# Patient Record
Sex: Female | Born: 1995 | Race: White | Hispanic: No | Marital: Single | Smoking: Never smoker
Health system: Southern US, Community
[De-identification: ages and names within clinical notes are randomized; demographics above are authoritative.]

## PROBLEM LIST (undated history)

## (undated) DIAGNOSIS — F419 Anxiety disorder, unspecified: Secondary | ICD-10-CM

---
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
## 2016-03-25 DIAGNOSIS — F900 Attention-deficit hyperactivity disorder, predominantly inattentive type: Secondary | ICD-10-CM | POA: Diagnosis not present

## 2016-11-06 DIAGNOSIS — F900 Attention-deficit hyperactivity disorder, predominantly inattentive type: Secondary | ICD-10-CM | POA: Diagnosis not present

## 2017-03-24 DIAGNOSIS — F4323 Adjustment disorder with mixed anxiety and depressed mood: Secondary | ICD-10-CM | POA: Diagnosis not present

## 2017-05-09 ENCOUNTER — Inpatient Hospital Stay (HOSPITAL_COMMUNITY)
Admission: AD | Admit: 2017-05-09 | Discharge: 2017-05-12 | DRG: 885 | Disposition: A | Discharge status: Home / Self Care | Payer: BLUE CROSS/BLUE SHIELD | Source: Intra-hospital | Attending: Psychiatry | Admitting: Psychiatry

## 2017-05-09 ENCOUNTER — Emergency Department (HOSPITAL_COMMUNITY)
Admission: EM | Admit: 2017-05-09 | Discharge: 2017-05-09 | Disposition: A | Discharge status: Other Acute Inpatient Hospital | Payer: BLUE CROSS/BLUE SHIELD | Attending: Emergency Medicine | Admitting: Emergency Medicine

## 2017-05-09 ENCOUNTER — Encounter (HOSPITAL_COMMUNITY): Payer: Self-pay | Admitting: Nurse Practitioner

## 2017-05-09 ENCOUNTER — Other Ambulatory Visit: Payer: Self-pay

## 2017-05-09 DIAGNOSIS — Z91040 Latex allergy status: Secondary | ICD-10-CM | POA: Diagnosis not present

## 2017-05-09 DIAGNOSIS — R55 Syncope and collapse: Secondary | ICD-10-CM

## 2017-05-09 DIAGNOSIS — F332 Major depressive disorder, recurrent severe without psychotic features: Secondary | ICD-10-CM | POA: Insufficient documentation

## 2017-05-09 DIAGNOSIS — F418 Other specified anxiety disorders: Secondary | ICD-10-CM | POA: Diagnosis not present

## 2017-05-09 DIAGNOSIS — Z79899 Other long term (current) drug therapy: Secondary | ICD-10-CM | POA: Insufficient documentation

## 2017-05-09 DIAGNOSIS — Z62811 Personal history of psychological abuse in childhood: Secondary | ICD-10-CM | POA: Diagnosis not present

## 2017-05-09 DIAGNOSIS — F410 Panic disorder [episodic paroxysmal anxiety] without agoraphobia: Secondary | ICD-10-CM | POA: Diagnosis present

## 2017-05-09 DIAGNOSIS — F909 Attention-deficit hyperactivity disorder, unspecified type: Secondary | ICD-10-CM | POA: Diagnosis not present

## 2017-05-09 DIAGNOSIS — E860 Dehydration: Secondary | ICD-10-CM

## 2017-05-09 DIAGNOSIS — Z630 Problems in relationship with spouse or partner: Secondary | ICD-10-CM | POA: Diagnosis not present

## 2017-05-09 DIAGNOSIS — F4323 Adjustment disorder with mixed anxiety and depressed mood: Secondary | ICD-10-CM

## 2017-05-09 DIAGNOSIS — R110 Nausea: Secondary | ICD-10-CM | POA: Diagnosis not present

## 2017-05-09 DIAGNOSIS — F419 Anxiety disorder, unspecified: Secondary | ICD-10-CM | POA: Diagnosis not present

## 2017-05-09 DIAGNOSIS — Z560 Unemployment, unspecified: Secondary | ICD-10-CM | POA: Diagnosis not present

## 2017-05-09 DIAGNOSIS — F1021 Alcohol dependence, in remission: Secondary | ICD-10-CM | POA: Diagnosis not present

## 2017-05-09 DIAGNOSIS — F3289 Other specified depressive episodes: Secondary | ICD-10-CM

## 2017-05-09 DIAGNOSIS — F1994 Other psychoactive substance use, unspecified with psychoactive substance-induced mood disorder: Secondary | ICD-10-CM | POA: Diagnosis not present

## 2017-05-09 DIAGNOSIS — R109 Unspecified abdominal pain: Secondary | ICD-10-CM | POA: Diagnosis not present

## 2017-05-09 DIAGNOSIS — R45851 Suicidal ideations: Secondary | ICD-10-CM | POA: Diagnosis not present

## 2017-05-09 DIAGNOSIS — R450 Nervousness: Secondary | ICD-10-CM | POA: Diagnosis not present

## 2017-05-09 HISTORY — DX: Anxiety disorder, unspecified: F41.9

## 2017-05-09 LAB — RAPID URINE DRUG SCREEN, HOSP PERFORMED
Amphetamines: POSITIVE — AB
BARBITURATES: NOT DETECTED
Benzodiazepines: NOT DETECTED
COCAINE: NOT DETECTED
Opiates: NOT DETECTED
TETRAHYDROCANNABINOL: NOT DETECTED

## 2017-05-09 LAB — COMPREHENSIVE METABOLIC PANEL
ALT: 13 U/L — AB (ref 14–54)
AST: 18 U/L (ref 15–41)
Albumin: 4.6 g/dL (ref 3.5–5.0)
Alkaline Phosphatase: 41 U/L (ref 38–126)
Anion gap: 9 (ref 5–15)
BUN: 8 mg/dL (ref 6–20)
CHLORIDE: 107 mmol/L (ref 101–111)
CO2: 25 mmol/L (ref 22–32)
Calcium: 8.8 mg/dL — ABNORMAL LOW (ref 8.9–10.3)
Creatinine, Ser: 0.68 mg/dL (ref 0.44–1.00)
GFR calc Af Amer: >60 mL/min (ref >60)
GFR calc non Af Amer: >60 mL/min (ref >60)
Glucose, Bld: 92 mg/dL (ref 65–99)
POTASSIUM: 3.8 mmol/L (ref 3.5–5.1)
SODIUM: 141 mmol/L (ref 135–145)
Total Bilirubin: 0.8 mg/dL (ref 0.3–1.2)
Total Protein: 7.3 g/dL (ref 6.5–8.1)

## 2017-05-09 LAB — CBC WITH DIFFERENTIAL/PLATELET
BASOS ABS: 0 10*3/uL (ref 0.0–0.1)
Basophils Relative: 0 %
EOS ABS: 0.1 10*3/uL (ref 0.0–0.7)
EOS PCT: 1 %
HCT: 38.7 % (ref 36.0–46.0)
Hemoglobin: 13.3 g/dL (ref 12.0–15.0)
LYMPHS ABS: 2 10*3/uL (ref 0.7–4.0)
LYMPHS PCT: 42 %
MCH: 29.8 pg (ref 26.0–34.0)
MCHC: 34.4 g/dL (ref 30.0–36.0)
MCV: 86.6 fL (ref 78.0–100.0)
MONO ABS: 0.4 10*3/uL (ref 0.1–1.0)
Monocytes Relative: 9 %
Neutro Abs: 2.3 10*3/uL (ref 1.7–7.7)
Neutrophils Relative %: 48 %
PLATELETS: 212 10*3/uL (ref 150–400)
RBC: 4.47 MIL/uL (ref 3.87–5.11)
RDW: 12.5 % (ref 11.5–15.5)
WBC: 4.7 10*3/uL (ref 4.0–10.5)

## 2017-05-09 LAB — URINALYSIS, ROUTINE W REFLEX MICROSCOPIC
BILIRUBIN URINE: NEGATIVE
GLUCOSE, UA: NEGATIVE mg/dL
HGB URINE DIPSTICK: NEGATIVE
KETONES UR: NEGATIVE mg/dL
NITRITE: NEGATIVE
PH: 5 (ref 5.0–8.0)
Protein, ur: NEGATIVE mg/dL
Specific Gravity, Urine: 1.018 (ref 1.005–1.030)

## 2017-05-09 LAB — ACETAMINOPHEN LEVEL

## 2017-05-09 LAB — I-STAT BETA HCG BLOOD, ED (MC, WL, AP ONLY)

## 2017-05-09 LAB — ETHANOL: ALCOHOL ETHYL (B): 80 mg/dL — AB (ref <10)

## 2017-05-09 LAB — SALICYLATE LEVEL

## 2017-05-09 MED ORDER — ACETAMINOPHEN 500 MG PO TABS: 500.0000 mg | ORAL_TABLET | Freq: Four times a day (QID) | ORAL | Status: DC | PRN

## 2017-05-09 MED ORDER — CITALOPRAM HYDROBROMIDE 10 MG PO TABS
10.0000 mg | ORAL_TABLET | Freq: Every day | ORAL | Status: DC
  Administered 2017-05-09: 10 mg via ORAL
  Filled 2017-05-09: qty 1

## 2017-05-09 MED ORDER — HYDROXYZINE HCL 25 MG PO TABS: 25.0000 mg | ORAL_TABLET | Freq: Four times a day (QID) | ORAL | Status: DC | PRN

## 2017-05-09 MED ORDER — LORAZEPAM 1 MG PO TABS: 1.0000 mg | ORAL_TABLET | Freq: Once | ORAL | Status: DC

## 2017-05-09 MED ORDER — SODIUM CHLORIDE 0.9 % IV BOLUS (SEPSIS)
1000.0000 mL | Freq: Once | INTRAVENOUS | Status: AC
  Administered 2017-05-09: 1000 mL via INTRAVENOUS

## 2017-05-09 MED ORDER — HYDROXYZINE HCL 25 MG PO TABS
25.0000 mg | ORAL_TABLET | Freq: Once | ORAL | Status: AC
  Administered 2017-05-09: 25 mg via ORAL
  Filled 2017-05-09: qty 1

## 2017-05-09 NOTE — ED Notes (Signed)
TTS at bedside. 

## 2017-05-09 NOTE — ED Provider Notes (Signed)
[facility]-EMERGENCY DEPT Provider Note   CSN: [id number] Arrival date & time: 05/09/17  [id number]     History   Chief Complaint Chief Complaint  Patient presents with  . Anxiety  . Suicidal    HPI Penny Mendoza is a 21 y.o. female history of ADHD presenting with anxiety.  Patient states that her boyfriend broke up with her about a year ago and they have been trying to work out the relationship since then.  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  She also works a job and just was told this morning that she was fired from the job.  She subsequently became very upset over the situation and became tearful.  She has been depressed over the last year or so since her breakup with her boyfriend.  Her father died when she was teenage years and she has occasional bouts of depression since then but is not currently on medicines for depression.  She has no particular plans to kill herself but just feels very depressed and anxious.  She is not currently on any medicines for anxiety and takes Adderall as needed for ADHD.   The history is provided by the patient.    Past Medical History:  Diagnosis Date  . Anxiety     There are no active problems to display for this patient.   History reviewed. No pertinent surgical history.  OB History    No data available       Home Medications    Prior to Admission medications   Medication Sig Start Date End Date Taking? Authorizing Provider  amphetamine-dextroamphetamine (ADDERALL XR) 30 MG 24 hr capsule Take 30 mg by mouth daily.   Yes [provider]  BIOTIN PO Take 1 tablet by mouth daily.   Yes [provider]  Misc Natural Products (PERFORMANCE FORMULA PO) Take 1 Scoop by mouth daily as needed (as needed for when she goes to the gym).   Yes [provider]    Family History History reviewed. No pertinent family history.  Social History Social History   Tobacco Use  .  Smoking status: Never Smoker  . Smokeless tobacco: Never Used  Substance Use Topics  . Alcohol use: Yes    Comment: occassionaly   . Drug use: No     Allergies   Latex   Review of Systems Review of Systems  Psychiatric/Behavioral: Positive for dysphoric mood. The patient is nervous/anxious.   All other systems reviewed and are negative.    Physical Exam Updated Vital Signs BP 114/68 (BP Location: Right Arm)   Pulse 94   Temp 98.6 F (37 C) (Oral)   Resp 20   Ht 5\' 5"  (1.651 m)   Wt 61.2 kg (135 lb)   LMP 04/14/2017 (Approximate)   SpO2 100%   BMI 22.47 kg/m   Physical Exam  Constitutional: She is oriented to person, place, and time.  Anxious, tearful   HENT:  Head: Normocephalic.  Mouth/Throat: Oropharynx is clear and moist.  Eyes: Conjunctivae and EOM are normal. Pupils are equal, round, and reactive to light.  Neck: Normal range of motion. Neck supple.  Cardiovascular: Normal rate, regular rhythm and normal heart sounds.  Pulmonary/Chest: Effort normal and breath sounds normal. No stridor. No respiratory distress. She has no wheezes.  Abdominal: Soft. Bowel sounds are normal. She exhibits no distension. There is no tenderness. There is no guarding.  Musculoskeletal: Normal range of motion.  Neurological: She  is alert and oriented to person, place, and time. No cranial nerve deficit. Coordination normal.  Skin: Skin is warm.  Psychiatric:  Anxious, tearful   Nursing note and vitals reviewed.    ED Treatments / Results  Labs (all labs ordered are listed, but only abnormal results are displayed) Labs Reviewed  COMPREHENSIVE METABOLIC PANEL - Abnormal; Notable for the following components:      Result Value   Calcium 8.8 (*)    ALT 13 (*)    All other components within normal limits  URINALYSIS, ROUTINE W REFLEX MICROSCOPIC - Abnormal; Notable for the following components:   APPearance HAZY (*)    Leukocytes, UA SMALL (*)    Bacteria, UA RARE (*)     Squamous Epithelial / LPF 6-30 (*)    All other components within normal limits  RAPID URINE DRUG SCREEN, HOSP PERFORMED - Abnormal; Notable for the following components:   Amphetamines POSITIVE (*)    All other components within normal limits  ETHANOL - Abnormal; Notable for the following components:   Alcohol, Ethyl (B) 80 (*)    All other components within normal limits  ACETAMINOPHEN LEVEL - Abnormal; Notable for the following components:   Acetaminophen (Tylenol), Serum <10 (*)    All other components within normal limits  CBC WITH DIFFERENTIAL/PLATELET  SALICYLATE LEVEL  I-STAT BETA HCG BLOOD, ED ([facility], [facility], [facility] ONLY)    EKG  EKG Interpretation None       Radiology No results found.  Procedures Procedures (including critical care time)  Medications Ordered in ED Medications  acetaminophen (TYLENOL) tablet 500 mg (not administered)  hydrOXYzine (ATARAX/VISTARIL) tablet 25 mg (25 mg Oral Given 05/09/17 1216)     Initial Impression / Assessment and Plan / ED Course  I have reviewed the triage vital signs and the nursing notes.  Pertinent labs & imaging results that were available during my care of the patient were reviewed by me and considered in my medical decision making (see chart for details).    Penny Mendoza is a 21 y.o. female here with anxiety, depressed mood. No obvious suicidal plan. She has multiple stressors. Will get psych clearance labs and consult TTS. She is voluntary.  1:18 PM Labs unremarkable. ETOH 80. Tox otherwise negative. UA contaminated and she has no symptoms. Medically cleared. Psych saw patient and recommend inpatient admission.     Final Clinical Impressions(s) / ED Diagnoses   Final diagnoses:  None    ED Discharge Orders    None       Charlynne PanderYao, Jillyan Plitt Hsienta, MD 05/09/17 1318

## 2017-05-09 NOTE — ED Notes (Signed)
[facility] called for transport, currently at [facility] , advised for a delayed of arrival .

## 2017-05-09 NOTE — BH Assessment (Signed)
[facility] Assessment Progress Note  Per Nanine MeansJamison Lord, DNP, this pt requires psychiatric hospitalization at this time.  Berneice Heinrichina Tate, RN, [id number].  Pt has signed Voluntary Admission and Consent for Treatment, as well as Consent to Release Information to pt's aunt and mother, and signed forms have [facility].  Pt's nurse has been notified, and agrees to send original paperwork along with pt via [facility], and to call report to [phone].  Doylene Canninghomas Cadie Sorci, MA Triage Specialist [phone]

## 2017-05-09 NOTE — ED Notes (Signed)
ED Provider at bedside. 

## 2017-05-09 NOTE — Progress Notes (Signed)
Approximately 1620 today, [facility] was just [facility].  BP sitting was 109/67 P106, O2 Stat 99% RA, T98.0, R18.  Patient was asked to stand BP 85/50 P82.  Patient stated she did not feel good, that she had tunnel vision.  BP was 51/27.  AC, charge nurse informed.  EMS called and patient was transported to ED via ambulance.  Patient's mother was informed by phone.   Patient denied SI and HI.  Denied A/V hallucinations.

## 2017-05-09 NOTE — ED Provider Notes (Signed)
[facility] Provider Note   CSN: [id number] Arrival date & time: 05/09/17  1652     History   Chief Complaint Chief Complaint  Patient presents with  . Hypotension  . [facility]    HPI Penny PrimroseHannah Joyce Mendoza is a 21 y.o. female.  HPI   21 [facility] concern for near syncopal episode.  She had presented to the emergency department this morning with concern for anxiety, depression, who [facility]  [facility], they were obtaining orthostatic vital signs.  Patient reports that when she went from sitting to standing she had an episode of lightheadedness, feeling like she could not see, and was diaphoretic.  Did not have chest pain, shortness of breath.  Reports that she did drink alcohol last night for the first time in a while.  Reports that she has not been eating or drinking well.  Her menses have started and she had an episode of abdominal cramping as well.  No black or bloody stools, fevers, chest pain, dyspnea or other concerns.  Past Medical History:  Diagnosis Date  . Anxiety     Patient Active Problem List   Diagnosis Date Noted  . 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 No pertinent surgical history.  OB History    No data available       Home Medications    Prior to Admission medications   Medication Sig Start Date End Date Taking? Authorizing Provider  amphetamine-dextroamphetamine (ADDERALL XR) 30 MG 24 hr capsule Take 30 mg by mouth daily.   Yes [provider]  ibuprofen (ADVIL,MOTRIN) 200 MG tablet Take 200 mg by mouth every 6 (six) hours as needed for headache.   Yes [provider]  Misc Natural Products (PERFORMANCE FORMULA PO) Take 1 Scoop by mouth daily as needed (as needed for when she goes to the gym).    [provider]    Family History History  reviewed. No pertinent family history.  Social History Social History   Tobacco Use  . Smoking status: Never Smoker  . Smokeless tobacco: Never Used  Substance Use Topics  . Alcohol use: Yes    Comment: occassionaly   . Drug use: No     Allergies   Latex   Review of Systems Review of Systems  Constitutional: Negative for fever.  HENT: Negative for sore throat.   Eyes: Negative for visual disturbance.  Respiratory: Negative for cough and shortness of breath.   Cardiovascular: Negative for chest pain.  Gastrointestinal: Positive for abdominal pain (had cramping which resolved). Negative for nausea and vomiting.  Genitourinary: Negative for difficulty urinating.  Musculoskeletal: Negative for back pain and neck pain.  Skin: Negative for rash.  Neurological: Positive for light-headedness. Negative for syncope, facial asymmetry, weakness, numbness and headaches.     Physical Exam Updated Vital Signs BP 116/69 (BP Location: Right Arm)   Pulse 87   Temp 98.6 F (37 C) (Oral)   Resp 16   Ht 5\' 6"  (1.676 m)   Wt 60.8 kg (134 lb)   LMP 04/14/2017 (Approximate)   SpO2 98%   BMI 21.63 kg/m   Physical Exam  Constitutional: She is oriented to person, place, and time. She appears well-developed and well-nourished. No distress.  HENT:  Head: Normocephalic and atraumatic.  Eyes: Conjunctivae and EOM are normal.  Neck: Normal range of motion.  Cardiovascular: Normal rate, regular rhythm, normal heart sounds and intact distal pulses. Exam reveals no gallop and no friction rub.  No murmur heard. Pulmonary/Chest: Effort normal and breath sounds normal. No respiratory distress. She has no wheezes. She has no rales.  Abdominal: Soft. She exhibits no distension. There is no tenderness. There is no guarding.  Musculoskeletal: She exhibits no edema or tenderness.  Neurological: She is alert and oriented to person, place, and time.  Skin: Skin is warm and dry. No rash noted. She is  not diaphoretic. No erythema.  Nursing note and vitals reviewed.    ED Treatments / Results  Labs (all labs ordered are listed, but only abnormal results are displayed) Labs Reviewed - No data to display  EKG  EKG Interpretation  Date/Time:  Friday May 09 2017 17:47:08 EST Ventricular Rate:  73 PR Interval:  154 QRS Duration: 108 QT Interval:  388 QTC Calculation: 427 R Axis:   101 Text Interpretation:  Normal sinus rhythm Rightward axis Borderline ECG No previous ECGs available Confirmed by Alvira MondaySchlossman, Cosby Proby ([id number]) on 05/09/2017 7:25:03 PM       Radiology No results found.  Procedures Procedures (including critical care time)  Medications Ordered in ED Medications  sodium chloride 0.9 % bolus 1,000 mL (0 mLs Intravenous Stopped 05/09/17 1836)  sodium chloride 0.9 % bolus 1,000 mL (0 mLs Intravenous Stopped 05/09/17 2116)     Initial Impression / Assessment and Plan / ED Course  I have reviewed the triage vital signs and the nursing notes.  Pertinent labs & imaging results that were available during my care of the patient were reviewed by me and considered in my medical decision making (see chart for details).     21 year [facility] with concern for near syncopal episode.  She had presented to the emergency department this morning with concern for anxiety, depression, [facility].  EKG completed shows no sign of Brugada, prolonged QTC, delta waves, focal, or acute ST changes.  Labs completed hours ago today, show no sign of anemia, no significant electrolyte abnormalities, and show negative pregnancy test.  Agree that urinalysis appears contaminated, patient is without urinary symptoms and have low suspicion for UTI as etiology of this.  Patient's episode is most likely secondary to orthostatic changes in the setting of patient drinking last night, and having dehydration.  There are no other signs of emergent cause of  presyncopal episode.  She is given IV fluids with improvement.  [facility].  Final Clinical Impressions(s) / ED Diagnoses   Final diagnoses:  Near syncope  Dehydration    ED Discharge Orders    None       Alvira MondaySchlossman, Salomon Ganser, MD 05/10/17 [phone]

## 2017-05-09 NOTE — BH Assessment (Signed)
B[facility] Assessment Progress Note  Case was staffed with Lord DNP who recommended a inpatient admission as appropriate bed placement is investigated.         

## 2017-05-09 NOTE — ED Triage Notes (Signed)
Patient reports having severe anxiety and thoughts of harming self. Mom is at bedside. Per patient she and her boyfriend broke up and [place] moving to [place]. She reports "I have separation issues ever since my dad passed away when I was 12 and I use my boyfriends to fill his void. Now he is moving and could care less how I am feeling. I called to tell my job I was going to be unable to come to work today and they fired me! I never miss work or be late. Today is just not my day." Patient is tearful and has head down throughout conversation. When she first arrived she was crying hysterically and yelling about her situations. She is now calm yet still tearful.

## 2017-05-09 NOTE — ED Notes (Signed)
Pt.'s personal belongings except her pair of boots taken home by Mother.

## 2017-05-09 NOTE — ED Triage Notes (Signed)
[facility] reportedly [facility]. Reports of a diaphoretic episode, pt is denying any medical complaints.
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
## 2017-05-09 NOTE — ED Notes (Signed)
Bed: WA26 Expected date:  Expected time:  Means of arrival:  Comments: 

## 2017-05-10 ENCOUNTER — Encounter (HOSPITAL_COMMUNITY): Payer: Self-pay | Admitting: *Deleted

## 2017-05-10 ENCOUNTER — Other Ambulatory Visit: Payer: Self-pay

## 2017-05-10 DIAGNOSIS — F332 Major depressive disorder, recurrent severe without psychotic features: Principal | ICD-10-CM

## 2017-05-10 DIAGNOSIS — Z62811 Personal history of psychological abuse in childhood: Secondary | ICD-10-CM

## 2017-05-10 DIAGNOSIS — Z560 Unemployment, unspecified: Secondary | ICD-10-CM

## 2017-05-10 DIAGNOSIS — F1994 Other psychoactive substance use, unspecified with psychoactive substance-induced mood disorder: Secondary | ICD-10-CM

## 2017-05-10 DIAGNOSIS — F4323 Adjustment disorder with mixed anxiety and depressed mood: Secondary | ICD-10-CM

## 2017-05-10 DIAGNOSIS — F909 Attention-deficit hyperactivity disorder, unspecified type: Secondary | ICD-10-CM

## 2017-05-10 DIAGNOSIS — Z630 Problems in relationship with spouse or partner: Secondary | ICD-10-CM

## 2017-05-10 MED ORDER — CITALOPRAM HYDROBROMIDE 10 MG PO TABS: 10.0000 mg | ORAL_TABLET | Freq: Every day | ORAL | Status: DC

## 2017-05-10 MED ORDER — HYDROXYZINE HCL 25 MG PO TABS
25.0000 mg | ORAL_TABLET | Freq: Four times a day (QID) | ORAL | Status: DC | PRN
  Administered 2017-05-10 – 2017-05-11 (×2): 25 mg via ORAL
  Filled 2017-05-10 (×2): qty 1

## 2017-05-10 MED ORDER — CHLORDIAZEPOXIDE HCL 25 MG PO CAPS: 25.0000 mg | ORAL_CAPSULE | Freq: Three times a day (TID) | ORAL | Status: DC | PRN

## 2017-05-10 MED ORDER — IBUPROFEN 600 MG PO TABS
600.0000 mg | ORAL_TABLET | Freq: Three times a day (TID) | ORAL | Status: AC
  Administered 2017-05-10 – 2017-05-11 (×3): 600 mg via ORAL
  Filled 2017-05-10 (×2): qty 1

## 2017-05-10 MED ORDER — SERTRALINE HCL 25 MG PO TABS
25.0000 mg | ORAL_TABLET | Freq: Every day | ORAL | Status: DC
  Administered 2017-05-11 – 2017-05-12 (×2): 25 mg via ORAL
  Filled 2017-05-10 (×5): qty 1

## 2017-05-10 MED ORDER — ALUM & MAG HYDROXIDE-SIMETH 200-200-20 MG/5ML PO SUSP: 30.0000 mL | ORAL | Status: DC | PRN

## 2017-05-10 MED ORDER — ACETAMINOPHEN 500 MG PO TABS
500.0000 mg | ORAL_TABLET | Freq: Four times a day (QID) | ORAL | Status: DC | PRN
  Administered 2017-05-11 – 2017-05-12 (×2): 500 mg via ORAL
  Filled 2017-05-10 (×2): qty 1

## 2017-05-10 MED ORDER — VITAMIN B-1 100 MG PO TABS
100.0000 mg | ORAL_TABLET | Freq: Every day | ORAL | Status: DC
  Administered 2017-05-11 – 2017-05-12 (×2): 100 mg via ORAL
  Filled 2017-05-10 (×5): qty 1

## 2017-05-10 MED ORDER — MAGNESIUM HYDROXIDE 400 MG/5ML PO SUSP: 30.0000 mL | Freq: Every day | ORAL | Status: DC | PRN
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
## 2017-05-10 NOTE — [facility] Suicide Risk Assessment (Signed)
[facility]   Nursing information obtained from:    Demographic factors:    Current Mental Status:    Loss Factors:    Historical Factors:    Risk Reduction Factors:     Total Time spent with patient: 30 minutes Principal Problem: <principal problem not specified> Diagnosis:   Patient Active Problem List   Diagnosis Date Noted  . Major depressive disorder, recurrent severe without psychotic features (HCC) [F33.2] 05/09/2017   Subjective Data:  21 y.o Caucasian female, single, student at [employer]. No past history of mental illness. Diagnosed with ADHD by her PCP. Reports harmful use of alcohol. Presented to the ER on account of suicidal thoughts. She was just fired from her job. Patient reported hopeless and worthlessness. She has been ruminating on the negative things that happened in her life ,,,, loss of her father five years ago ,,,,, bad break up with her boyfriend a year ago. Patient sent a text message to her mom expressing thoughts of overdosing on over the counter medications. Routine labs is significant for slightly decreased calcium. Toxicology is negative,  UDS positive for amphetamine , BAL 80 mg/dl. Current suicidal thoughts was not planned. She reacted impulsively after her ex-boyfriend cancelled their plan to attend a music festival. Patient is reacting to abandonment. She has been ruminating on the negatives in her life. She has a past history of suicidal behavior. No family history of suicide or mental illness.  No evidence of psychosis. No evidence of mania. No cognitive impairment. No access to weapons.  She is cooperative with care. She has agreed to treatment recommendations. She has agreed to communicate suicidal thoughts of with staff if the thoughts becomes overwhelming.     Continued Clinical Symptoms:  Alcohol Use Disorder Identification Test Final Score (AUDIT): 2 The "Alcohol Use Disorders Identification Test", Guidelines for Use in Primary  Care, Second Edition.  World Science writerHealth Organization [facility](WHO). Score between 0-7:  no or low risk or alcohol related problems. Score between 8-15:  moderate risk of alcohol related problems. Score between 16-19:  high risk of alcohol related problems. Score 20 or above:  warrants further diagnostic evaluation for alcohol dependence and treatment.   CLINICAL FACTORS:   Depression:   Impulsivity Alcohol/Substance Abuse/Dependencies   Musculoskeletal: Strength & Muscle Tone: within normal limits Gait & Station: normal Patient leans: N/A  Psychiatric Specialty Exam: Physical Exam  ROS  Blood pressure 116/69, pulse 87, temperature 98.6 F (37 C), temperature source Oral, resp. rate 16, height 5\' 6"  (1.676 m), weight 60.8 kg (134 lb), last menstrual period 04/14/2017, SpO2 98 %.Body mass index is 21.63 kg/m.  General Appearance:   Eye Contact:  As in H&P  Speech:    Volume:    Mood:    Affect:    Thought Process:    Orientation:    Thought Content:  As in H&P  Suicidal Thoughts:    Homicidal Thoughts:    Memory:    Judgement:    Insight:    Psychomotor Activity:    Concentration:    Recall:    Fund of Knowledge:  As in H&P  Language:    Akathisia:    Handed:    AIMS (if indicated):     Assets:    ADL's:    Cognition:  As in H&P  Sleep:  Number of Hours: 4      COGNITIVE FEATURES THAT CONTRIBUTE TO RISK:  None    SUICIDE RISK:   Minimal: No  identifiable suicidal ideation.  Patients presenting with no risk factors but with morbid ruminations; may be classified as minimal risk based on the severity of the depressive symptoms  PLAN OF CARE:  As in H&P  I certify that inpatient services furnished can reasonably be expected to improve the patient's condition.   Georgiann CockerVincent A Izediuno, MD 05/10/2017, 10:48 AM

## 2017-05-10 NOTE — Tx Team (Signed)
Initial Treatment Plan 05/10/2017 12:49 AM Penny PrimroseHannah Joyce Rogness [id number]    PATIENT STRESSORS: Marital or family conflict Occupational concerns   PATIENT STRENGTHS: Ability for insight Average or above average intelligence Capable of independent living General fund of knowledge Motivation for treatment/growth Supportive family/friends   PATIENT IDENTIFIED PROBLEMS: Depression Anxiety Suicidal thoughts "Coping mechanisms with anxiety": "Learning to control myself when I have anxiety attacks"                     DISCHARGE CRITERIA:  Ability to meet basic life and health needs Improved stabilization in mood, thinking, and/or behavior Reduction of life-threatening or endangering symptoms to within safe limits Verbal commitment to aftercare and medication compliance  PRELIMINARY DISCHARGE PLAN: Attend aftercare/continuing care group Return to previous living arrangement  PATIENT/FAMILY INVOLVEMENT: This treatment plan has been presented to and reviewed with the patient, Penny PrimroseHannah Joyce Mendoza, and/or family member, .  The patient and family have been given the opportunity to ask questions and make suggestions.  Franshesca Chipman, [place] Wayne, [place]RN 05/10/2017, 12:49 AM

## 2017-05-10 NOTE — Progress Notes (Signed)
Penny Mendoza is a 21 year old female pt admitted on voluntary basis. [facility] when she had a syncopal episode that led to her going back to ED for evaluation. Upon being re-admitted she spoke about how she was feeling dizzy earlier and felt like she couldn't see but reports that she is feeling better and denies any dizziness or other symptoms. She spoke about how she has been feeling depressed and anxious and did speak about a recent break-up. She denies any SI during admission and is able to contract for safety while in the hospital. She reports that she takes her medications as prescribed and feels that it is helping with her anxiety. She reports that she wants to learn coping skills and to work on her anxiety while she is here. She denies any drug usage and reports only occasional alcohol use. She reports that she goes to school [employer] and reports that she lives in an apartment off-campus and will go back there after discharge. Penny Mendoza was oriented to the unit and safety maintained.
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
## 2017-05-10 NOTE — Progress Notes (Signed)
D Pt endorses a flat, depressed, disconnected affect today. Inititally, she refused to wake up and complete her daily assessment. Then she was able to get up, at which point she completed her daily assessment and on it she wrote she denied SI today and she rated her depression, hopelessness and anxeity " 2/3/4", respectively. A She has been observed sitting by herself in the dayroom  this afternoon. R She requested ibuprofen for c/o menstrual cramps and wirter obtained order from NP.

## 2017-05-10 NOTE — [facility] Group Notes (Signed)
[facility]  Date/Time:    05/10/2017 10:00-11:00AM  Type of Therapy and Topic:  Group Therapy:  Healthy vs Unhealthy Coping Skills  Participation Level:  Active   Description of Group:  The focus of this group was to determine what unhealthy coping techniques typically are used by group members and what healthy coping techniques would be helpful in coping with various problems. Patients were guided in becoming aware of the differences between healthy and unhealthy coping techniques.  Patients were asked to identify 1-2 healthy coping skills they would like to learn to use more effectively,.  Therapeutic Goals 1. Patients learned that coping is what human beings do all day long to deal with various situations in their lives 2. Patients defined and discussed healthy vs unhealthy coping techniques 3. Patients identified their preferred coping techniques and identified whether these were healthy or unhealthy 4. Patients determined 1-2 healthy coping skills they would like to become more familiar with and use more often, and practiced a few meditations 5. Patients provided support and ideas to each other  Summary of Patient Progress: During group, patient expressed she does not like being in the hospital but feels it will be helpful, as it has been "a long time coming."  She talked about staying in unhealthy relationships as an unhealthy coping skill that is related to her father's death when she was 12yo and how she hates to have relationships end as a result.   Therapeutic Modalities Cognitive Behavioral Therapy Motivational Interviewing   Ambrose MantleMareida Grossman-Orr, LCSW 05/10/2017, 12:32 PM

## 2017-05-11 DIAGNOSIS — F4323 Adjustment disorder with mixed anxiety and depressed mood: Secondary | ICD-10-CM

## 2017-05-11 NOTE — [facility] Group Notes (Signed)
[facility] Group Notes: Skills Group   Date:  05/11/2017  Time:  4:39 PM  Type of Therapy:  Psychoeducational Skills  Participation Level:  Active  Participation Quality:  Appropriate and Attentive  Affect:  Appropriate  Cognitive:  Alert and Appropriate  Insight:  Appropriate  Engagement in Group:  Engaged  Modes of Intervention:  Activity, Discussion and Education  Summary of Progress/Problems: Patient attended group and was engaged.   Marzetta BoardDopson, Zaide Mcclenahan E 05/11/2017, 4:39 PM

## 2017-05-11 NOTE — [facility] Group Notes (Signed)
[facility] LCSW Group Therapy Note  Date/Time:  05/11/2017 10:00-11:00AM  Type of Therapy and Topic:  Group Therapy:  Healthy and Unhealthy Supports  Participation Level:  Minimal   Description of Group:  Patients in this group were introduced to the idea of adding a variety of healthy supports to address the various needs in their lives. Patients identified and discussed what additional healthy supports could be helpful in their recovery and wellness after discharge in order to prevent future hospitalizations.   An emphasis was placed on using counselor, doctor, therapy groups, 12-step groups, and problem-specific support groups to expand supports.   Therapeutic Goals:   1)  discuss importance of adding supports to stay well once out of the hospital  2)  compare healthy versus unhealthy supports and identify some examples of each  3)  generate ideas and descriptions of healthy supports that can be added  4)  offer mutual support about how to address unhealthy supports  5)  encourage active participation in and adherence to discharge plan    Summary of Patient Progress:  The patient participated only a little at the end in the discussion and expressed that a song that was played really spoke to her loneliness in her anxiety, that others don't understand her feelings because they don't have the severity of anxiety that she does.  She was tearful and sad throughout group.   Therapeutic Modalities:   Motivational Interviewing Brief Solution-Focused Therapy  Ambrose MantleMareida Grossman-Orr, LCSW 05/11/2017, 11:00AM

## 2017-05-11 NOTE — Progress Notes (Signed)
[facility] MD Progress Note  05/11/2017 12:56 PM Penny PrimroseHannah Joyce Mendoza  MRN:  [id number]   Subjective: Patient stated, "I feel better today and I slept good last night". Patient wanting to know when she can be discharged. She is hoping to be discharge by tomorrow or Tuesday as she has a plane ticket and a concert to [place] in [place] on Thursday.  Objective: Chart reviewed, interviewed patient one on one, patient awake, alert and oriented x4. Patient appears in a pleasant mood and bright affect. Patient stated that she slept well last night. She started her Zoloft 25 mg this morning and started feeling a little tired this morning but not sure if it has to do with the medications. She stating that  she is attending group and participating well. She denies any suicidal or homicidal thoughts as well as visual and auditory hallucinations. She stated that she is worried because her boss told her mom that they will let her go for not showing up to work; and she's also worried that her boyfriend is relocating but says she's come to the acceptance for that.   Principal Problem: Major depressive disorder, recurrent severe without psychotic features (HCC) Diagnosis:   Patient Active Problem List   Diagnosis Date Noted  . Substance induced mood disorder (HCC) [F19.94] 05/10/2017  . Adjustment disorder with mixed anxiety and depressed mood [F43.23] 05/10/2017  . Major depressive disorder, recurrent severe without psychotic features (HCC) [F33.2] 05/09/2017   Total Time spent with patient: 30 minutes  Past Psychiatric History: As in H&P  Past Medical History:  Past Medical History:  Diagnosis Date  . Anxiety    History reviewed. No pertinent surgical history. Family History: History reviewed. No pertinent family history. Family Psychiatric  History: Unknown Social History:  Social History   Substance and Sexual Activity  Alcohol Use Yes   Comment: occassionaly      Social History   Substance and  Sexual Activity  Drug Use No    Social History   Socioeconomic History  . Marital status: Single    Spouse name: None  . Number of children: None  . Years of education: None  . Highest education level: None  Social Needs  . Financial resource strain: None  . Food insecurity - worry: None  . Food insecurity - inability: None  . Transportation needs - medical: None  . Transportation needs - non-medical: None  Occupational History  . None  Tobacco Use  . Smoking status: Never Smoker  . Smokeless tobacco: Never Used  Substance and Sexual Activity  . Alcohol use: Yes    Comment: occassionaly   . Drug use: No  . Sexual activity: Yes    Birth control/protection: Condom  Other Topics Concern  . None  Social History Narrative  . None   Additional Social History:     Sleep: Good  Appetite:  Good  Current Medications: Current Facility-Administered Medications  Medication Dose Route Frequency Provider Last Rate Last Dose  . acetaminophen (TYLENOL) tablet 500 mg  500 mg Oral Q6H PRN Charm RingsLord, Jamison Y, NP      . alum & mag hydroxide-simeth (MAALOX/MYLANTA) 200-200-20 MG/5ML suspension 30 mL  30 mL Oral Q4H PRN Charm RingsLord, Jamison Y, NP      . chlordiazePOXIDE (LIBRIUM) capsule 25 mg  25 mg Oral TID PRN Georgiann CockerIzediuno, Vincent A, MD      . hydrOXYzine (ATARAX/VISTARIL) tablet 25 mg  25 mg Oral Q6H PRN Charm RingsLord, Jamison Y, NP   25 mg  at 05/10/17 2241  . ibuprofen (ADVIL,MOTRIN) tablet 600 mg  600 mg Oral 3 x daily with food Oneta RackLewis, Tanika N, NP   600 mg at 05/11/17 0813  . magnesium hydroxide (MILK OF MAGNESIA) suspension 30 mL  30 mL Oral Daily PRN Charm RingsLord, Jamison Y, NP      . sertraline (ZOLOFT) tablet 25 mg  25 mg Oral Daily Izediuno, Delight OvensVincent A, MD   25 mg at 05/11/17 0813  . thiamine (VITAMIN B-1) tablet 100 mg  100 mg Oral Daily Izediuno, Vincent A, MD   100 mg at 05/11/17 0813    Lab Results: No results found for this or any previous visit (from the past 48 hour(s)).  Blood Alcohol level:   Lab Results  Component Value Date   ETH 80 (H) 05/09/2017    Metabolic Disorder Labs: No results found for: HGBA1C, MPG No results found for: PROLACTIN No results found for: CHOL, TRIG, HDL, CHOLHDL, VLDL, LDLCALC  Physical Findings: AIMS: Facial and Oral Movements Muscles of Facial Expression: None, normal Lips and Perioral Area: None, normal Jaw: None, normal Tongue: None, normal,Extremity Movements Upper (arms, wrists, hands, fingers): None, normal Lower (legs, knees, ankles, toes): None, normal, Trunk Movements Neck, shoulders, hips: None, normal, Overall Severity Severity of abnormal movements (highest score from questions above): None, normal Incapacitation due to abnormal movements: None, normal Patient's awareness of abnormal movements (rate only patient's report): No Awareness, Dental Status Current problems with teeth and/or dentures?: No Does patient usually wear dentures?: No  CIWA:    COWS:     Musculoskeletal: Strength & Muscle Tone: within normal limits Gait & Station: normal Patient leans: N/A  Psychiatric Specialty Exam: Physical Exam  Vitals reviewed. Constitutional: She is oriented to person, place, and time. She appears well-developed and well-nourished.  HENT:  Head: Normocephalic and atraumatic.  Eyes: Pupils are equal, round, and reactive to light.  Neck: Normal range of motion.  Cardiovascular: Normal rate, regular rhythm and normal heart sounds.  Respiratory: Effort normal and breath sounds normal.  GI: Soft. Bowel sounds are normal.  Musculoskeletal: Normal range of motion.  Neurological: She is alert and oriented to person, place, and time.  Skin: Skin is warm and dry.    Review of Systems  Psychiatric/Behavioral: Positive for depression (improved). Negative for hallucinations, memory loss, substance abuse and suicidal ideas. The patient is nervous/anxious. The patient does not have insomnia.   All other systems reviewed and are  negative.   Blood pressure 109/63, pulse 74, temperature 98 F (36.7 C), temperature source Oral, resp. rate 20, height 5\' 6"  (1.676 m), weight 60.8 kg (134 lb), last menstrual period 04/14/2017, SpO2 98 %.Body mass index is 21.63 kg/m.  General Appearance: Casual  Eye Contact:  Good  Speech:  Clear and Coherent and Normal Rate  Volume:  Normal  Mood:  Euthymic  Affect:  Appropriate and Congruent  Thought Process:  Coherent and Goal Directed  Orientation:  Full (Time, Place, and Person)  Thought Content:  WDL and Logical  Suicidal Thoughts:  No  Homicidal Thoughts:  No  Memory:  Immediate;   Good Recent;   Good Remote;   Fair  Judgement:  Fair  Insight:  Good and Present  Psychomotor Activity:  Normal  Concentration:  Concentration: Good and Attention Span: Good  Recall:  Good  Fund of Knowledge:  Good  Language:  Good  Akathisia:  Negative  Handed:  Right  AIMS (if indicated):     Assets:  Communication Skills  Desire for Improvement Financial Resources/Insurance Housing Intimacy Leisure Time Physical Health Resilience Social Support  ADL's:  Intact  Cognition:  WNL  Sleep:  Number of Hours: 6.75     Treatment Plan Summary: Daily contact with patient to assess and evaluate symptoms and progress in treatment and Medication management  PLAN: -Continue Sertraline 25 mg daily for depression -Continue Librium protocol for alcohol detox -Encourage unit groups and activities -Monitor mood, behavior and interaction with peers -Motivational enhancement -SW to continue disposition placement.    Delila Pereyra, NP 05/11/2017, 12:56 PM

## 2017-05-11 NOTE — Progress Notes (Signed)
D: Pt denies SI/HI/AV hallucinations.  Pt goal for today is to work on dealing with her anxiety. Patient was able to name some coping skills for anxiety.  A: Pt was offered support and encouragement. Pt was given scheduled medications. Pt was encourage to attend groups. Q 15 minute checks were done for safety.  R: Pt has no complaints.Pt receptive to treatment and safety maintained on unit.

## 2017-05-12 DIAGNOSIS — R450 Nervousness: Secondary | ICD-10-CM

## 2017-05-12 DIAGNOSIS — F419 Anxiety disorder, unspecified: Secondary | ICD-10-CM

## 2017-05-12 DIAGNOSIS — F1994 Other psychoactive substance use, unspecified with psychoactive substance-induced mood disorder: Secondary | ICD-10-CM

## 2017-05-12 DIAGNOSIS — F1021 Alcohol dependence, in remission: Secondary | ICD-10-CM

## 2017-05-12 MED ORDER — SERTRALINE HCL 25 MG PO TABS: 25.0000 mg | ORAL_TABLET | Freq: Every day | ORAL | 0 refills | Status: DC
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
## 2017-05-12 NOTE — [facility] Suicide Risk Assessment (Addendum)
[facility] INPATIENT:  Family/Significant Other Suicide Prevention Education  Suicide Prevention Education:  Education Completed; Penny Mendoza, mother, [phone], has been identified by the patient as the family member/significant other with whom the patient will be residing, and identified as the person(s) who will aid the patient in the event of a mental health crisis (suicidal ideations/suicide attempt).  With written consent from the patient, the family member/significant other has been provided the following suicide prevention education, prior to the and/or following the discharge of the patient.  The suicide prevention education provided includes the following:  Suicide risk factors  Suicide prevention and interventions  National Suicide Hotline telephone number  [facility] assessment telephone number  [facility] Emergency Assistance 911  [facility] and/or Residential Mobile Crisis Unit telephone number  Request made of family/significant other to:  Remove weapons (e.g., guns, rifles, knives), all items previously/currently identified as safety concern.  No guns that mother is aware of.  Remove drugs/medications (over-the-counter, prescriptions, illicit drugs), all items previously/currently identified as a safety concern.  The family member/significant other verbalizes understanding of the suicide prevention education information provided.  The family member/significant other agrees to remove the items of safety concern listed above.  Mother reports that she thinks pt needs a new therapist at this time. No specific reason given.    Lorri FrederickWierda, Dawanda Mapel Jon, LCSW 05/12/2017, 2:03 PM

## 2017-05-12 NOTE — Progress Notes (Signed)
Patient ID: Penny Mendoza, female   DOB: 23-Feb-1996, 21 y.o.   MRN: [id number]  DAR Note: Pt observed in the dayroom interacting with peers. Pt at the time of assessment complained of moderate abdominal cramping. Pt denied anxiety, depression, SI, HI or AVH; "I feel my antidepressant is working; they say 2 weeks for I feel better now." Pt was med compliant. All patient's questions and concerns addressed. Support, encouragement, and safe environment provided. 15-minute safety checks continue.
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
## 2017-05-12 NOTE — Progress Notes (Signed)
[facility] MD Progress Note  05/12/2017 10:15 AM Penny Mendoza  MRN:  [id number]   Subjective: Penny Mendoza reports,  "I'm feeling so much better now. I was feeling overwhelmed with work & school responsibilities that I did not quite take care of myself. I was also having some personal issues with my boyfriend that I impulsively threatened to kill myself. But I did not mean it. I'm feeling great. Taking the Zoloft, I feel it is getting in my system now. I feel kind of ready to go home now".  Objective: Penny Mendoza is a 21 y.o Caucasian female, single, Ship broker at [employer]. No past history of mental illness. Diagnosed with ADHD by her PCP. Reports harmful use of alcohol. Presented to the ER on account of suicidal thoughts. She was just fired from her job. Patient reported hopeless and worthlessness. She has been ruminating on the negative things that happened in her life ,,,, loss of her father five years ago ,,,,, bad break up with her boyfriend a year ago. She came to the hospital for mood stabilization treatments. Today, 05-12-17, Penny Mendoza is seen, chart reviewed, met with patient one on one. She is awake, alert and oriented x 4. She presents in an improved mood & bright affect. She says she slept well last night. She was started on Zoloft 25 mg for her depression. She currently denies any adverse effects. She is attending & participating in the group sessions. She denies any suicidal or homicidal thoughts as well as visual and auditory hallucinations. She says today that she is relieved that the job that was causing her a lot of stress is no more there after discharge. She has been let go of the job & it is okay.She says she currently feels she is feeling ready to be discharged as she is getting back to her baseline. She denies any SIHI, AVH, delusional thoughts or paranoia. She does not appear to be responding to any internal stimuli.  Principal Problem: Major depressive disorder, recurrent severe without psychotic  features ([place])  Diagnosis:   Patient Active Problem List   Diagnosis Date Noted  . Substance induced mood disorder ([place]) [F19.94] 05/10/2017  . Adjustment disorder with mixed anxiety and depressed mood [F43.23] 05/10/2017  . Major depressive disorder, recurrent severe without psychotic features ([place]) [F33.2] 05/09/2017   Total Time spent with patient: 15 minutes  Past Psychiatric History: See H&P  Past Medical History:  Past Medical History:  Diagnosis Date  . Anxiety     History reviewed. No pertinent surgical history. Family History: History reviewed. No pertinent family history.  Family Psychiatric  History: See H&P Social History:  Social History   Substance and Sexual Activity  Alcohol Use Yes   Comment: occassionaly      Social History   Substance and Sexual Activity  Drug Use No    Social History   Socioeconomic History  . Marital status: Single    Spouse name: None  . Number of children: None  . Years of education: None  . Highest education level: None  Social Needs  . Financial resource strain: None  . Food insecurity - worry: None  . Food insecurity - inability: None  . Transportation needs - medical: None  . Transportation needs - non-medical: None  Occupational History  . None  Tobacco Use  . Smoking status: Never Smoker  . Smokeless tobacco: Never Used  Substance and Sexual Activity  . Alcohol use: Yes    Comment: occassionaly   . Drug use:  No  . Sexual activity: Yes    Birth control/protection: Condom  Other Topics Concern  . None  Social History Narrative  . None   Additional Social History:     Sleep: Good  Appetite:  Good  Current Medications: Current Facility-Administered Medications  Medication Dose Route Frequency Provider Last Rate Last Dose  . acetaminophen (TYLENOL) tablet 500 mg  500 mg Oral Q6H PRN Patrecia Pour, NP   500 mg at 05/12/17 7062  . alum & mag hydroxide-simeth (MAALOX/MYLANTA) 200-200-20 MG/5ML suspension  30 mL  30 mL Oral Q4H PRN Patrecia Pour, NP      . chlordiazePOXIDE (LIBRIUM) capsule 25 mg  25 mg Oral TID PRN Artist Beach, MD      . hydrOXYzine (ATARAX/VISTARIL) tablet 25 mg  25 mg Oral Q6H PRN Patrecia Pour, NP   25 mg at 05/11/17 2200  . magnesium hydroxide (MILK OF MAGNESIA) suspension 30 mL  30 mL Oral Daily PRN Patrecia Pour, NP      . sertraline (ZOLOFT) tablet 25 mg  25 mg Oral Daily Izediuno, Laruth Bouchard, MD   25 mg at 05/12/17 3762  . thiamine (VITAMIN B-1) tablet 100 mg  100 mg Oral Daily Izediuno, Laruth Bouchard, MD   100 mg at 05/12/17 8315    Lab Results: No results found for this or any previous visit (from the past 48 hour(s)).  Blood Alcohol level:  Lab Results  Component Value Date   ETH 80 (H) 17/61/6073   Metabolic Disorder Labs: No results found for: HGBA1C, MPG No results found for: PROLACTIN No results found for: CHOL, TRIG, HDL, CHOLHDL, VLDL, LDLCALC  Physical Findings: AIMS: Facial and Oral Movements Muscles of Facial Expression: None, normal Lips and Perioral Area: None, normal Jaw: None, normal Tongue: None, normal,Extremity Movements Upper (arms, wrists, hands, fingers): None, normal Lower (legs, knees, ankles, toes): None, normal, Trunk Movements Neck, shoulders, hips: None, normal, Overall Severity Severity of abnormal movements (highest score from questions above): None, normal Incapacitation due to abnormal movements: None, normal Patient's awareness of abnormal movements (rate only patient's report): No Awareness, Dental Status Current problems with teeth and/or dentures?: No Does patient usually wear dentures?: No  CIWA:    COWS:     Musculoskeletal: Strength & Muscle Tone: within normal limits Gait & Station: normal Patient leans: N/A  Psychiatric Specialty Exam: Physical Exam  Vitals reviewed. Constitutional: She is oriented to person, place, and time. She appears well-developed and well-nourished.  HENT:  Head:  Normocephalic and atraumatic.  Eyes: Pupils are equal, round, and reactive to light.  Neck: Normal range of motion.  Cardiovascular: Normal rate, regular rhythm and normal heart sounds.  Respiratory: Effort normal and breath sounds normal.  GI: Soft. Bowel sounds are normal.  Musculoskeletal: Normal range of motion.  Neurological: She is alert and oriented to person, place, and time.  Skin: Skin is warm and dry.    Review of Systems  Psychiatric/Behavioral: Positive for depression (improved). Negative for hallucinations, memory loss, substance abuse and suicidal ideas. The patient is nervous/anxious. The patient does not have insomnia.   All other systems reviewed and are negative.   Blood pressure 109/63, pulse 74, temperature 98 F (36.7 C), temperature source Oral, resp. rate 20, height '5\' 6"'$  (1.676 m), weight 60.8 kg (134 lb), last menstrual period 04/14/2017, SpO2 98 %.Body mass index is 21.63 kg/m.  General Appearance: Casual  Eye Contact:  Good  Speech:  Clear and Coherent and Normal  Rate  Volume:  Normal  Mood:  Euthymic  Affect:  Appropriate and Congruent  Thought Process:  Coherent and Goal Directed  Orientation:  Full (Time, Place, and Person)  Thought Content:  Logical, denies any hallucinations, delusions or paranoia.  Suicidal Thoughts:  Denies  Homicidal Thoughts:  Denies  Memory:  Immediate;   Good Recent;   Good Remote;   Fair  Judgement:  Fair  Insight:  Good and Present  Psychomotor Activity:  Normal  Concentration:  Concentration: Good and Attention Span: Good  Recall:  Good  Fund of Knowledge:  Good  Language:  Good  Akathisia:  Negative  Handed:  Right  AIMS (if indicated):     Assets:  Communication Skills Desire for Improvement Financial Resources/Insurance Housing Intimacy Leisure Time Physical Health Resilience Social Support  ADL's:  Intact  Cognition:  WNL  Sleep:  Number of Hours: 6.75   Treatment Plan Summary: Daily contact with  patient to assess and evaluate symptoms and progress in treatment and Medication management: Patient is approaching her baseline. There are no evidence of psychosis or mania. She denies any dangerousness to self or others. We are finalizing aftercare plan.  Continue inpatient hospitalization.  Will continue today 05/12/2017 plan as below except where it is noted.  PLAN: Depression.    - Continue Sertraline 25 mg daily for depression.  Alcohol withdrawal symptoms.    - Continue the Librium 25 mg po prn per CIWA.  Anxiety.   - Continue Hydroxyzine 25 mg po prn Q 6 hours.     - Continue to encourage unit groups & activities participation.    - Monitor mood, behavior and interaction with peers  -SW to continue the discharge disposition plan.   Encarnacion Slates, NP, PMHNP, FNP-BC 05/12/2017, 10:15 AMPatient ID: Penny Mendoza, female   DOB: 10-25-1995, 21 y.o.   MRN: [id number]

## 2017-05-12 NOTE — Progress Notes (Signed)
D: Patient states that she would like to be discharged today.  She signed her 72 hour on 05/09/17 at 2355.  Patient states that she would be able to get an uber ride.  She denies any thoughts of self harm.  Her mood is pleasant and she is interacting well with staff and peers.  Patient attended group this morning.  She appears to be progressing well.  A: Continue to monitor medication management and MD orders.  Safety checks completed every 15 minutes per protocol.  Offer support and encouragement as needed.  R: Patient is receptive to staff; her behavior is appropriate.

## 2017-05-12 NOTE — Progress Notes (Signed)
[facility] Group Notes:  (Nursing/MHT/Case Management/Adjunct)  Date:  05/12/2017  Time:  1100-1135  Nursing Group  For this group the patients watched a TED Talk entitled "The Power of Vulnerability" by Brene Brown.  Afterwards each patient introduced themselves, why they were here, and something they got from the video.  Patient attended for entirety of group, was alert and attentive, and shared appropriately. 

## 2017-05-12 NOTE — Progress Notes (Signed)
Discharge note:  Patient discharged home per MD order.  Patient received all personal belongings from unit and locker.  Reviewed AVS/transition record with patient and she indicated understanding.  Patient denies any thoughts of self harm.  [facility]. She left with prescriptions of her medications.  Patient left ambulatory with mother and step dad.

## 2017-05-12 NOTE — [facility] Suicide Risk Assessment (Signed)
[facility]   Principal Problem: Major depressive disorder, recurrent severe without psychotic features [facility](HCC) Discharge Diagnoses:  Patient Active Problem List   Diagnosis Date Noted  . Substance induced mood disorder (HCC) [F19.94] 05/10/2017  . Adjustment disorder with mixed anxiety and depressed mood [F43.23] 05/10/2017  . Major depressive disorder, recurrent severe without psychotic features ([facility]CC) [F33.2] 05/09/2017    Total Time spent with patient: 30 minutes  Musculoskeletal: Strength & Muscle Tone: within normal limits Gait & Station: normal Patient leans: N/A  Psychiatric Specialty Exam: Review of Systems  Constitutional: Negative.   HENT: Negative.   Eyes: Negative.   Respiratory: Negative.   Cardiovascular: Negative.   Gastrointestinal: Negative.   Genitourinary: Negative.   Musculoskeletal: Negative.   Skin: Negative.   Neurological: Negative.   Endo/Heme/Allergies: Negative.   Psychiatric/Behavioral: Negative for depression, hallucinations, memory loss, substance abuse and suicidal ideas. The patient is not nervous/anxious and does not have insomnia.     Blood pressure 109/63, pulse 74, temperature 98 F (36.7 C), temperature source Oral, resp. rate 20, height 5\' 6"  (1.676 m), weight 60.8 kg (134 lb), last menstrual period 04/14/2017, SpO2 98 %.Body mass index is 21.63 kg/m.  General Appearance: Neatly dressed, pleasant, engaging well and cooperative. Appropriate behavior. Not in any distress. Good relatedness. Not internally stimulated.  Eye Contact::  Good  Speech:  Spontaneous, normal prosody. Normal tone and rate.   Volume:  Normal  Mood:  Euthymic  Affect:  Appropriate and Full Range  Thought Process:  Linear  Orientation:  Full (Time, Place, and Person)  Thought Content:  Future oriented. No delusional theme. No preoccupation with violent thoughts. No negative ruminations. No obsession.  No hallucination in any modality.    Suicidal Thoughts:  No  Homicidal Thoughts:  No  Memory:  Immediate;   Good Recent;   Good Remote;   Good  Judgement:  Good  Insight:  Good  Psychomotor Activity:  Normal  Concentration:  Good  Recall:  Good  Fund of Knowledge:Good  Language: Good  Akathisia:  Negative  Handed:    AIMS (if indicated):     Assets:  Communication Skills Desire for Improvement Financial Resources/Insurance Housing Leisure Time Physical Health Resilience Social Support Talents/Skills Transportation Vocational/Educational  Sleep:  Number of Hours: 6.75  Cognition: WNL  ADL's:  Intact   Clinical Assessment::   21 y.o Caucasian female, single, Consulting civil engineerstudent at [employer]. No past history of mental illness. Diagnosed with ADHD by her PCP. Reports harmful use of alcohol. Presented to the ER on account of suicidal thoughts. She was just fired from her job. Patient reported hopeless and worthlessness. She has been ruminating on the negative things that happened in her life ,,,, loss of her father five years ago ,,,,, bad break up with her boyfriend a year ago. Patient sent a text message to her mom expressing thoughts of overdosing on over the counter medications. Routine labs is significant for slightly decreased calcium. Toxicology is negative,  UDS positive for amphetamine , BAL 80 mg/dl.  Seen today. Says she has been able to process has last relationship. Patient says she has moved on. No longer ruminating on him. Says she wants to get back to her school work. She has been feeling good. No longer feeling depressed. She has been sleeping well. Feels her medication is helping. Normal appetite and energy. Says she is able to enjoy being around people. No suicidal thoughts. No homicidal thoughts. No thoughts of violence. Says she has  been speaking with her family and they have been very supportive. Says she now has insight into how toxic her last relationship was. No withdrawal symptoms. No evidence of psychosis. No  evidence of mania. No access to weapons. No overwhelming anxiety.  I spoke to her mom Aurther Lofterry on [phone]. Says patient has been in a toxic relationship. They have been off and on in the relationship. Says she does not feel patient wanted to kill herself. Felt she wanted attention. Says they have been communicating with her. Says they feel she is getting over the relationship. Aurther Lofterry feels she is ready to go back to school. Says her husband Dorene SorrowJerry would pick her up later. No concerns about safety.   Nursing staff reports that patient has been appropriate on the unit. Patient has been interacting well with peers. No behavioral issues. Patient has not voiced any suicidal thoughts. Patient has not been observed to be internally stimulated. Patient has been adherent with treatment recommendations. Patient has been tolerating their medication well.   Patient was discussed at team. Team members feels that patient is back to her baseline level of function. Team agrees with plan to discharge patient today.  Demographic Factors:  NA  Loss Factors: Loss of significant relationship  Historical Factors: Impulsivity  Risk Reduction Factors:   Sense of responsibility to family, Living with another person, especially a relative, Positive social support, Positive therapeutic relationship and Positive coping skills or problem solving skills  Continued Clinical Symptoms:  As above   Cognitive Features That Contribute To Risk:  None    Suicide Risk:  Minimal: No identifiable suicidal ideation.  Patient is not having any thoughts of suicide at this time. Modifiable risk factors targeted during this admission includes depression and substance use. Demographical and historical risk factors cannot be modified. Patient is now engaging well. Patient is reliable and is future oriented. We have buffered patient's support structures. At this point, patient is at low risk of suicide. Patient is aware of the effects of  psychoactive substances on decision making process. Patient has been provided with emergency contacts. Patient acknowledges to use resources provided if unforseen circumstances changes their current risk stratification.    Follow-up Information    [facility] Follow up.   Why:  Social worker will confirm your next therapy appointment with Alan Ripperlaire. Contact information: [address] [place] [address] [phone]        [facility] Follow up.   Why:  Social worker will set an appointment for your medication management follow-up. Contact information: [address] [place], [place] [address] Phone: [phone]          Plan Of Care/Follow-up recommendations:  1. Continue current psychotropic medications 2. Mental health and addiction follow up as arranged.  3. Discharge in care of her family 4. Provided limited quantity of prescriptions  Georgiann CockerVincent A Nikeia Henkes, MD 05/12/2017, 1:04 PM

## 2017-05-12 NOTE — Plan of Care (Signed)
  Adequate for Discharge Activity: Interest or engagement in activities will improve 05/12/2017 1348 - Adequate for Discharge by Angela AdamBeaudry, Timotheus Salm E, RN Sleeping patterns will improve 05/12/2017 1348 - Adequate for Discharge by Angela AdamBeaudry, Lyrique Hakim E, RN Education: Knowledge of [place] [facility] Education information/materials will improve 05/12/2017 1348 - Adequate for Discharge by Angela AdamBeaudry, Airam Runions E, RN Emotional status will improve 05/12/2017 1348 - Adequate for Discharge by Angela AdamBeaudry, Saleha Kalp E, RN Mental status will improve 05/12/2017 1348 - Adequate for Discharge by Angela AdamBeaudry, Creston Klas E, RN Coping: Ability to cope will improve 05/12/2017 1348 - Adequate for Discharge by Angela AdamBeaudry, Evyn Putzier E, RN Activity: Interest or engagement in leisure activities will improve 05/12/2017 1348 - Adequate for Discharge by Angela AdamBeaudry, Henchy Mccauley E, RN Imbalance in normal sleep/wake cycle will improve 05/12/2017 1348 - Adequate for Discharge by Angela AdamBeaudry, Sumaya Riedesel E, RN Education: Utilization of techniques to improve thought processes will improve 05/12/2017 1348 - Adequate for Discharge by Angela AdamBeaudry, Eldon Zietlow E, RN Knowledge of the prescribed therapeutic regimen will improve 05/12/2017 1348 - Adequate for Discharge by Angela AdamBeaudry, Wilian Kwong E, RN Coping: Ability to cope will improve 05/12/2017 1348 - Adequate for Discharge by Angela AdamBeaudry, Vennie Waymire E, RN Ability to verbalize feelings will improve 05/12/2017 1348 - Adequate for Discharge by Angela AdamBeaudry, Kayston Jodoin E, RN Health Behavior/Discharge Planning: Ability to make decisions will improve 05/12/2017 1348 - Adequate for Discharge by Angela AdamBeaudry, Abrahan Fulmore E, RN Role Relationship: Ability to demonstrate positive changes in social behaviors and relationships will improve 05/12/2017 1348 - Adequate for Discharge by Angela AdamBeaudry, Earnstine Meinders E, RN Safety: Ability to identify and utilize support systems that promote safety will improve 05/12/2017 1348 - Adequate for Discharge by  Angela AdamBeaudry, Wendee Hata E, RN Self-Concept: Ability to verbalize positive feelings about self will improve 05/12/2017 1348 - Adequate for Discharge by Angela AdamBeaudry, Markeith Jue E, RN Level of anxiety will decrease 05/12/2017 1348 - Adequate for Discharge by Angela AdamBeaudry, Xyler Terpening E, RN Education: Ability to state activities that reduce stress will improve 05/12/2017 1348 - Adequate for Discharge by Angela AdamBeaudry, Wednesday Ericsson E, RN Self-Concept: Ability to identify factors that promote anxiety will improve 05/12/2017 1348 - Adequate for Discharge by Angela AdamBeaudry, Jaylin Roundy E, RN Ability to modify response to factors that promote anxiety will improve 05/12/2017 1348 - Adequate for Discharge by Angela AdamBeaudry, Amilio Zehnder E, RN

## 2017-05-19 DIAGNOSIS — F329 Major depressive disorder, single episode, unspecified: Secondary | ICD-10-CM | POA: Diagnosis not present

## 2017-05-19 DIAGNOSIS — F900 Attention-deficit hyperactivity disorder, predominantly inattentive type: Secondary | ICD-10-CM | POA: Diagnosis not present

## 2017-08-25 DIAGNOSIS — Z3202 Encounter for pregnancy test, result negative: Secondary | ICD-10-CM | POA: Diagnosis not present

## 2017-08-25 DIAGNOSIS — Z01419 Encounter for gynecological examination (general) (routine) without abnormal findings: Secondary | ICD-10-CM | POA: Diagnosis not present

## 2017-08-25 DIAGNOSIS — Z6823 Body mass index (BMI) 23.0-23.9, adult: Secondary | ICD-10-CM | POA: Diagnosis not present

## 2017-08-25 DIAGNOSIS — Z113 Encounter for screening for infections with a predominantly sexual mode of transmission: Secondary | ICD-10-CM | POA: Diagnosis not present

## 2017-09-22 DIAGNOSIS — Z3202 Encounter for pregnancy test, result negative: Secondary | ICD-10-CM | POA: Diagnosis not present

## 2017-09-22 DIAGNOSIS — Z113 Encounter for screening for infections with a predominantly sexual mode of transmission: Secondary | ICD-10-CM | POA: Diagnosis not present

## 2017-09-22 DIAGNOSIS — A749 Chlamydial infection, unspecified: Secondary | ICD-10-CM | POA: Diagnosis not present

## 2017-10-20 DIAGNOSIS — R635 Abnormal weight gain: Secondary | ICD-10-CM | POA: Diagnosis not present

## 2017-10-20 DIAGNOSIS — F900 Attention-deficit hyperactivity disorder, predominantly inattentive type: Secondary | ICD-10-CM | POA: Diagnosis not present

## 2017-10-20 DIAGNOSIS — Z0000 Encounter for general adult medical examination without abnormal findings: Secondary | ICD-10-CM | POA: Diagnosis not present

## 2017-11-24 DIAGNOSIS — Z23 Encounter for immunization: Secondary | ICD-10-CM | POA: Diagnosis not present

## 2017-11-24 DIAGNOSIS — R635 Abnormal weight gain: Secondary | ICD-10-CM | POA: Diagnosis not present

## 2017-11-24 DIAGNOSIS — Z13220 Encounter for screening for lipoid disorders: Secondary | ICD-10-CM | POA: Diagnosis not present

## 2018-02-06 ENCOUNTER — Observation Stay (HOSPITAL_COMMUNITY): Payer: BLUE CROSS/BLUE SHIELD | Admitting: Certified Registered Nurse Anesthetist

## 2018-02-06 ENCOUNTER — Encounter (HOSPITAL_COMMUNITY)
Admission: EM | Disposition: A | Discharge status: Home / Self Care | Payer: Self-pay | Source: Home / Self Care | Attending: Emergency Medicine

## 2018-02-06 ENCOUNTER — Observation Stay (HOSPITAL_COMMUNITY)
Admission: EM | Admit: 2018-02-06 | Discharge: 2018-02-07 | Disposition: A | Discharge status: Home / Self Care | Payer: BLUE CROSS/BLUE SHIELD | Attending: Surgery | Admitting: Surgery

## 2018-02-06 ENCOUNTER — Encounter (HOSPITAL_COMMUNITY): Payer: Self-pay

## 2018-02-06 ENCOUNTER — Emergency Department (HOSPITAL_COMMUNITY): Payer: BLUE CROSS/BLUE SHIELD

## 2018-02-06 ENCOUNTER — Other Ambulatory Visit: Payer: Self-pay

## 2018-02-06 DIAGNOSIS — K3589 Other acute appendicitis without perforation or gangrene: Secondary | ICD-10-CM

## 2018-02-06 DIAGNOSIS — R109 Unspecified abdominal pain: Secondary | ICD-10-CM | POA: Diagnosis not present

## 2018-02-06 DIAGNOSIS — Z91040 Latex allergy status: Secondary | ICD-10-CM | POA: Insufficient documentation

## 2018-02-06 DIAGNOSIS — K37 Unspecified appendicitis: Secondary | ICD-10-CM | POA: Diagnosis present

## 2018-02-06 DIAGNOSIS — F909 Attention-deficit hyperactivity disorder, unspecified type: Secondary | ICD-10-CM | POA: Diagnosis not present

## 2018-02-06 DIAGNOSIS — Z79899 Other long term (current) drug therapy: Secondary | ICD-10-CM | POA: Insufficient documentation

## 2018-02-06 DIAGNOSIS — K3580 Unspecified acute appendicitis: Principal | ICD-10-CM | POA: Diagnosis present

## 2018-02-06 DIAGNOSIS — K388 Other specified diseases of appendix: Secondary | ICD-10-CM | POA: Diagnosis not present

## 2018-02-06 DIAGNOSIS — F418 Other specified anxiety disorders: Secondary | ICD-10-CM | POA: Diagnosis not present

## 2018-02-06 HISTORY — PX: LAPAROSCOPIC APPENDECTOMY: SHX408

## 2018-02-06 LAB — CBC WITH DIFFERENTIAL/PLATELET
Basophils Absolute: 0 10*3/uL (ref 0.0–0.1)
Basophils Relative: 0 %
Eosinophils Absolute: 0.1 10*3/uL (ref 0.0–0.7)
Eosinophils Relative: 0 %
HCT: 41 % (ref 36.0–46.0)
Hemoglobin: 14 g/dL (ref 12.0–15.0)
LYMPHS ABS: 2.8 10*3/uL (ref 0.7–4.0)
Lymphocytes Relative: 20 %
MCH: 30.4 pg (ref 26.0–34.0)
MCHC: 34.1 g/dL (ref 30.0–36.0)
MCV: 88.9 fL (ref 78.0–100.0)
Monocytes Absolute: 0.8 10*3/uL (ref 0.1–1.0)
Monocytes Relative: 6 %
NEUTROS PCT: 74 %
Neutro Abs: 10.4 10*3/uL — ABNORMAL HIGH (ref 1.7–7.7)
Platelets: 210 10*3/uL (ref 150–400)
RBC: 4.61 MIL/uL (ref 3.87–5.11)
RDW: 12 % (ref 11.5–15.5)
WBC: 14 10*3/uL — AB (ref 4.0–10.5)

## 2018-02-06 LAB — HEPATIC FUNCTION PANEL
ALT: 19 U/L (ref 0–44)
AST: 24 U/L (ref 15–41)
Albumin: 4.7 g/dL (ref 3.5–5.0)
Alkaline Phosphatase: 60 U/L (ref 38–126)
BILIRUBIN INDIRECT: 0.7 mg/dL (ref 0.3–0.9)
Bilirubin, Direct: 0.1 mg/dL (ref 0.0–0.2)
Total Bilirubin: 0.8 mg/dL (ref 0.3–1.2)
Total Protein: 8 g/dL (ref 6.5–8.1)

## 2018-02-06 LAB — BASIC METABOLIC PANEL
ANION GAP: 11 (ref 5–15)
BUN: 17 mg/dL (ref 6–20)
CHLORIDE: 103 mmol/L (ref 98–111)
CO2: 26 mmol/L (ref 22–32)
Calcium: 9.4 mg/dL (ref 8.9–10.3)
Creatinine, Ser: 0.87 mg/dL (ref 0.44–1.00)
GFR calc non Af Amer: >60 mL/min (ref >60)
Glucose, Bld: 90 mg/dL (ref 70–99)
POTASSIUM: 3.7 mmol/L (ref 3.5–5.1)
SODIUM: 140 mmol/L (ref 135–145)

## 2018-02-06 LAB — I-STAT BETA HCG BLOOD, ED (MC, WL, AP ONLY)

## 2018-02-06 LAB — LIPASE, BLOOD: LIPASE: 42 U/L (ref 11–51)

## 2018-02-06 SURGERY — APPENDECTOMY, LAPAROSCOPIC
Anesthesia: General

## 2018-02-06 MED ORDER — SUCCINYLCHOLINE CHLORIDE 200 MG/10ML IV SOSY
PREFILLED_SYRINGE | INTRAVENOUS | Status: AC
  Filled 2018-02-06: qty 10

## 2018-02-06 MED ORDER — KETAMINE HCL 10 MG/ML IJ SOLN
INTRAMUSCULAR | Status: DC | PRN
  Administered 2018-02-06: 30 mg via INTRAVENOUS

## 2018-02-06 MED ORDER — ROCURONIUM BROMIDE 50 MG/5ML IV SOSY
PREFILLED_SYRINGE | INTRAVENOUS | Status: DC | PRN
  Administered 2018-02-06: 30 mg via INTRAVENOUS

## 2018-02-06 MED ORDER — MORPHINE SULFATE (PF) 2 MG/ML IV SOLN: 1.0000 mg | INTRAVENOUS | Status: DC | PRN

## 2018-02-06 MED ORDER — PHENYLEPHRINE 40 MCG/ML (10ML) SYRINGE FOR IV PUSH (FOR BLOOD PRESSURE SUPPORT)
PREFILLED_SYRINGE | INTRAVENOUS | Status: AC
  Filled 2018-02-06: qty 10

## 2018-02-06 MED ORDER — SUGAMMADEX SODIUM 200 MG/2ML IV SOLN
INTRAVENOUS | Status: DC | PRN
  Administered 2018-02-06: 150 mg via INTRAVENOUS

## 2018-02-06 MED ORDER — BUPIVACAINE-EPINEPHRINE (PF) 0.5% -1:200000 IJ SOLN
INTRAMUSCULAR | Status: DC | PRN
  Administered 2018-02-06: 30 mL

## 2018-02-06 MED ORDER — SODIUM CHLORIDE 0.9 % IV SOLN
INTRAVENOUS | Status: DC
  Administered 2018-02-06: 03:00:00 via INTRAVENOUS

## 2018-02-06 MED ORDER — ONDANSETRON HCL 4 MG/2ML IJ SOLN
INTRAMUSCULAR | Status: DC | PRN
  Administered 2018-02-06: 4 mg via INTRAVENOUS

## 2018-02-06 MED ORDER — LACTATED RINGERS IR SOLN
Status: DC | PRN
  Administered 2018-02-06: 1000 mL

## 2018-02-06 MED ORDER — EPHEDRINE 5 MG/ML INJ
INTRAVENOUS | Status: AC
  Filled 2018-02-06: qty 10

## 2018-02-06 MED ORDER — HYDROCODONE-ACETAMINOPHEN 5-325 MG PO TABS: 1.0000 | ORAL_TABLET | ORAL | Status: DC | PRN

## 2018-02-06 MED ORDER — OXYCODONE HCL 5 MG PO TABS: 5.0000 mg | ORAL_TABLET | Freq: Once | ORAL | Status: DC | PRN

## 2018-02-06 MED ORDER — CHLORHEXIDINE GLUCONATE CLOTH 2 % EX PADS: 6.0000 | MEDICATED_PAD | Freq: Once | CUTANEOUS | Status: DC

## 2018-02-06 MED ORDER — ONDANSETRON HCL 4 MG/2ML IJ SOLN
4.0000 mg | Freq: Once | INTRAMUSCULAR | Status: AC
  Administered 2018-02-06: 4 mg via INTRAVENOUS
  Filled 2018-02-06: qty 2

## 2018-02-06 MED ORDER — FENTANYL CITRATE (PF) 100 MCG/2ML IJ SOLN
INTRAMUSCULAR | Status: DC | PRN
  Administered 2018-02-06: 25 ug via INTRAVENOUS
  Administered 2018-02-06: 100 ug via INTRAVENOUS
  Administered 2018-02-06: 25 ug via INTRAVENOUS
  Administered 2018-02-06 (×2): 50 ug via INTRAVENOUS

## 2018-02-06 MED ORDER — SCOPOLAMINE 1 MG/3DAYS TD PT72
MEDICATED_PATCH | TRANSDERMAL | Status: AC
  Filled 2018-02-06: qty 1

## 2018-02-06 MED ORDER — METHOCARBAMOL 500 MG PO TABS: 500.0000 mg | ORAL_TABLET | Freq: Three times a day (TID) | ORAL | Status: DC | PRN

## 2018-02-06 MED ORDER — HYDROCODONE-ACETAMINOPHEN 5-325 MG PO TABS
1.0000 | ORAL_TABLET | ORAL | Status: DC | PRN
  Administered 2018-02-06 – 2018-02-07 (×4): 1 via ORAL
  Filled 2018-02-06 (×4): qty 1

## 2018-02-06 MED ORDER — ACETAMINOPHEN 500 MG PO TABS
1000.0000 mg | ORAL_TABLET | ORAL | Status: AC
  Administered 2018-02-06: 1000 mg via ORAL
  Filled 2018-02-06: qty 2

## 2018-02-06 MED ORDER — METRONIDAZOLE IN NACL 5-0.79 MG/ML-% IV SOLN
500.0000 mg | Freq: Once | INTRAVENOUS | Status: AC
  Administered 2018-02-06: 500 mg via INTRAVENOUS
  Filled 2018-02-06: qty 100

## 2018-02-06 MED ORDER — HYDROMORPHONE HCL 1 MG/ML IJ SOLN
1.0000 mg | Freq: Once | INTRAMUSCULAR | Status: AC
  Administered 2018-02-06: 1 mg via INTRAVENOUS
  Filled 2018-02-06: qty 1

## 2018-02-06 MED ORDER — LACTATED RINGERS IV SOLN
INTRAVENOUS | Status: DC
  Administered 2018-02-06 (×2): via INTRAVENOUS

## 2018-02-06 MED ORDER — GABAPENTIN 300 MG PO CAPS
300.0000 mg | ORAL_CAPSULE | ORAL | Status: AC
  Administered 2018-02-06: 300 mg via ORAL
  Filled 2018-02-06: qty 1

## 2018-02-06 MED ORDER — MORPHINE SULFATE (PF) 4 MG/ML IV SOLN
4.0000 mg | Freq: Once | INTRAVENOUS | Status: AC
  Administered 2018-02-06: 4 mg via INTRAVENOUS
  Filled 2018-02-06: qty 1

## 2018-02-06 MED ORDER — SODIUM CHLORIDE 0.9 % IV SOLN
INTRAVENOUS | Status: DC
  Administered 2018-02-06 – 2018-02-07 (×2): via INTRAVENOUS

## 2018-02-06 MED ORDER — DEXAMETHASONE SODIUM PHOSPHATE 10 MG/ML IJ SOLN
INTRAMUSCULAR | Status: DC | PRN
  Administered 2018-02-06: 10 mg via INTRAVENOUS

## 2018-02-06 MED ORDER — FENTANYL CITRATE (PF) 100 MCG/2ML IJ SOLN: 25.0000 ug | INTRAMUSCULAR | Status: DC | PRN

## 2018-02-06 MED ORDER — KETAMINE HCL 10 MG/ML IJ SOLN
INTRAMUSCULAR | Status: AC
  Filled 2018-02-06: qty 1

## 2018-02-06 MED ORDER — ROCURONIUM BROMIDE 10 MG/ML (PF) SYRINGE
PREFILLED_SYRINGE | INTRAVENOUS | Status: AC
  Filled 2018-02-06: qty 10

## 2018-02-06 MED ORDER — IOPAMIDOL (ISOVUE-300) INJECTION 61%
INTRAVENOUS | Status: AC
  Filled 2018-02-06: qty 100

## 2018-02-06 MED ORDER — PROPOFOL 10 MG/ML IV BOLUS
INTRAVENOUS | Status: AC
  Filled 2018-02-06: qty 20

## 2018-02-06 MED ORDER — SODIUM CHLORIDE 0.9 % IV SOLN: 2.0000 g | Freq: Once | INTRAVENOUS | Status: DC

## 2018-02-06 MED ORDER — METRONIDAZOLE IN NACL 5-0.79 MG/ML-% IV SOLN: 500.0000 mg | Freq: Once | INTRAVENOUS | Status: DC

## 2018-02-06 MED ORDER — ONDANSETRON HCL 4 MG/2ML IJ SOLN: 4.0000 mg | Freq: Four times a day (QID) | INTRAMUSCULAR | Status: DC | PRN

## 2018-02-06 MED ORDER — SODIUM CHLORIDE 0.9 % IV SOLN
2.0000 g | Freq: Once | INTRAVENOUS | Status: AC
  Administered 2018-02-06: 2 g via INTRAVENOUS
  Filled 2018-02-06: qty 20

## 2018-02-06 MED ORDER — FENTANYL CITRATE (PF) 250 MCG/5ML IJ SOLN
INTRAMUSCULAR | Status: AC
  Filled 2018-02-06: qty 5

## 2018-02-06 MED ORDER — SUCCINYLCHOLINE CHLORIDE 200 MG/10ML IV SOSY
PREFILLED_SYRINGE | INTRAVENOUS | Status: DC | PRN
  Administered 2018-02-06: 100 mg via INTRAVENOUS

## 2018-02-06 MED ORDER — PHENYLEPHRINE 40 MCG/ML (10ML) SYRINGE FOR IV PUSH (FOR BLOOD PRESSURE SUPPORT)
PREFILLED_SYRINGE | INTRAVENOUS | Status: DC | PRN
  Administered 2018-02-06 (×2): 80 ug via INTRAVENOUS

## 2018-02-06 MED ORDER — MORPHINE SULFATE (PF) 2 MG/ML IV SOLN: 2.0000 mg | INTRAVENOUS | Status: DC | PRN

## 2018-02-06 MED ORDER — PROPOFOL 10 MG/ML IV BOLUS
INTRAVENOUS | Status: DC | PRN
  Administered 2018-02-06: 200 mg via INTRAVENOUS

## 2018-02-06 MED ORDER — CELECOXIB 200 MG PO CAPS
200.0000 mg | ORAL_CAPSULE | ORAL | Status: AC
  Administered 2018-02-06: 200 mg via ORAL
  Filled 2018-02-06: qty 1

## 2018-02-06 MED ORDER — LIDOCAINE 2% (20 MG/ML) 5 ML SYRINGE
INTRAMUSCULAR | Status: DC | PRN
  Administered 2018-02-06: 100 mg via INTRAVENOUS

## 2018-02-06 MED ORDER — SUGAMMADEX SODIUM 200 MG/2ML IV SOLN
INTRAVENOUS | Status: AC
  Filled 2018-02-06: qty 2

## 2018-02-06 MED ORDER — SCOPOLAMINE 1 MG/3DAYS TD PT72
MEDICATED_PATCH | TRANSDERMAL | Status: DC | PRN
  Administered 2018-02-06: 1 via TRANSDERMAL

## 2018-02-06 MED ORDER — ONDANSETRON HCL 4 MG/2ML IJ SOLN
INTRAMUSCULAR | Status: AC
  Filled 2018-02-06: qty 2

## 2018-02-06 MED ORDER — LIDOCAINE 2% (20 MG/ML) 5 ML SYRINGE
INTRAMUSCULAR | Status: AC
  Filled 2018-02-06: qty 5

## 2018-02-06 MED ORDER — IBUPROFEN 200 MG PO TABS: 400.0000 mg | ORAL_TABLET | ORAL | Status: DC | PRN

## 2018-02-06 MED ORDER — MIDAZOLAM HCL 2 MG/2ML IJ SOLN
INTRAMUSCULAR | Status: AC
  Filled 2018-02-06: qty 2

## 2018-02-06 MED ORDER — ACETAMINOPHEN 325 MG PO TABS
650.0000 mg | ORAL_TABLET | Freq: Three times a day (TID) | ORAL | Status: DC
  Administered 2018-02-06 – 2018-02-07 (×2): 650 mg via ORAL
  Filled 2018-02-06 (×3): qty 2

## 2018-02-06 MED ORDER — DEXAMETHASONE SODIUM PHOSPHATE 10 MG/ML IJ SOLN
INTRAMUSCULAR | Status: AC
  Filled 2018-02-06: qty 1

## 2018-02-06 MED ORDER — EPHEDRINE SULFATE-NACL 50-0.9 MG/10ML-% IV SOSY
PREFILLED_SYRINGE | INTRAVENOUS | Status: DC | PRN
  Administered 2018-02-06: 10 mg via INTRAVENOUS

## 2018-02-06 MED ORDER — OXYCODONE HCL 5 MG/5ML PO SOLN: 5.0000 mg | Freq: Once | ORAL | Status: DC | PRN

## 2018-02-06 MED ORDER — IOPAMIDOL (ISOVUE-300) INJECTION 61%
100.0000 mL | Freq: Once | INTRAVENOUS | Status: AC | PRN
  Administered 2018-02-06: 100 mL via INTRAVENOUS

## 2018-02-06 MED ORDER — BUPIVACAINE-EPINEPHRINE (PF) 0.5% -1:200000 IJ SOLN
INTRAMUSCULAR | Status: AC
  Filled 2018-02-06: qty 30

## 2018-02-06 MED ORDER — MIDAZOLAM HCL 5 MG/5ML IJ SOLN
INTRAMUSCULAR | Status: DC | PRN
  Administered 2018-02-06: 2 mg via INTRAVENOUS

## 2018-02-06 SURGICAL SUPPLY — 38 items
ADH SKN CLS APL DERMABOND .7 (GAUZE/BANDAGES/DRESSINGS) ×1
APL SKNCLS STERI-STRIP NONHPOA (GAUZE/BANDAGES/DRESSINGS)
APPLIER CLIP ROT 10 11.4 M/L (STAPLE) ×2
APR CLP MED LRG 11.4X10 (STAPLE) ×1
BAG SPEC RTRVL LRG 6X4 10 (ENDOMECHANICALS)
BENZOIN TINCTURE PRP APPL 2/3 (GAUZE/BANDAGES/DRESSINGS) IMPLANT
CABLE HIGH FREQUENCY MONO STRZ (ELECTRODE) ×1 IMPLANT
CHLORAPREP W/TINT 26ML (MISCELLANEOUS) ×2 IMPLANT
CLIP APPLIE ROT 10 11.4 M/L (STAPLE) IMPLANT
COVER SURGICAL LIGHT HANDLE (MISCELLANEOUS) ×2 IMPLANT
CUTTER FLEX LINEAR 45M (STAPLE) ×1 IMPLANT
DECANTER SPIKE VIAL GLASS SM (MISCELLANEOUS) ×2 IMPLANT
DERMABOND ADVANCED (GAUZE/BANDAGES/DRESSINGS) ×1
DERMABOND ADVANCED .7 DNX12 (GAUZE/BANDAGES/DRESSINGS) ×1 IMPLANT
DRAPE LAPAROSCOPIC ABDOMINAL (DRAPES) ×1 IMPLANT
ELECT REM PT RETURN 15FT ADLT (MISCELLANEOUS) ×2 IMPLANT
ENDOLOOP SUT PDS II  0 18 (SUTURE)
ENDOLOOP SUT PDS II 0 18 (SUTURE) IMPLANT
GLOVE SURG SIGNA 7.5 PF LTX (GLOVE) ×7 IMPLANT
GOWN STRL REUS W/TWL XL LVL3 (GOWN DISPOSABLE) ×5 IMPLANT
KIT BASIN OR (CUSTOM PROCEDURE TRAY) ×2 IMPLANT
POUCH SPECIMEN RETRIEVAL 10MM (ENDOMECHANICALS) ×1 IMPLANT
RELOAD 45 VASCULAR/THIN (ENDOMECHANICALS) IMPLANT
RELOAD STAPLE 45 2.5 WHT GRN (ENDOMECHANICALS) IMPLANT
RELOAD STAPLE 45 3.5 BLU ETS (ENDOMECHANICALS) IMPLANT
RELOAD STAPLE TA45 3.5 REG BLU (ENDOMECHANICALS) ×2 IMPLANT
SCISSORS LAP 5X35 DISP (ENDOMECHANICALS) ×2 IMPLANT
SET IRRIG TUBING LAPAROSCOPIC (IRRIGATION / IRRIGATOR) ×2 IMPLANT
SHEARS HARMONIC ACE PLUS 36CM (ENDOMECHANICALS) ×2 IMPLANT
SLEEVE XCEL OPT CAN 5 100 (ENDOMECHANICALS) ×2 IMPLANT
STRIP CLOSURE SKIN 1/2X4 (GAUZE/BANDAGES/DRESSINGS) IMPLANT
SUT MNCRL AB 4-0 PS2 18 (SUTURE) ×2 IMPLANT
SUT VIC AB 2-0 SH 18 (SUTURE) IMPLANT
TOWEL OR 17X26 10 PK STRL BLUE (TOWEL DISPOSABLE) ×2 IMPLANT
TOWEL OR NON WOVEN STRL DISP B (DISPOSABLE) ×2 IMPLANT
TRAY LAPAROSCOPIC (CUSTOM PROCEDURE TRAY) ×2 IMPLANT
TROCAR BLADELESS OPT 5 100 (ENDOMECHANICALS) ×2 IMPLANT
TROCAR XCEL BLUNT TIP 100MML (ENDOMECHANICALS) ×2 IMPLANT

## 2018-02-06 NOTE — H&P (Addendum)
[facility] Admission Note  Penny Mendoza [facility] January 03, 1996  [id number].    Requesting MD: Varney Biles Chief Complaint/Reason for Consult: acute appendicitis  HPI:  Penny Mendoza is a 22yo female who presented to [facility] early this morning with acute onset abdominal pain. States that she started having a minor ache in her abdomen yesterday afternoon, and last night the pain became severe. It is periumbilical and RLQ. Worse with movement. Associated with nausea. Denies emesis, fever, chills. Never had pain like this before. Last meal was yesterday around 2200.  ED workup included a CT scan which showed early acute appendicitis without evidence of rupture or abscess. WBC 14.0.  -PMH significant for ADHD -Abdominal surgical history: none -Anticoagulants: none -Vapes -Employment: Educational psychologist at [employer], and in school at [employer] for [employer]  ROS: Review of Systems  Constitutional: Negative.   HENT: Negative.   Eyes: Negative.   Respiratory: Negative.   Cardiovascular: Negative.   Gastrointestinal: Positive for abdominal pain and nausea. Negative for constipation, diarrhea and vomiting.  Genitourinary: Negative.   Musculoskeletal: Negative.   Skin: Negative.   Neurological: Negative.    All systems reviewed and otherwise negative except for as above  No family history on file.  Past Medical History:  Diagnosis Date  . Anxiety     No past surgical history on file.  Social History:  reports that she has never smoked. She has never used smokeless tobacco. She reports that she drinks alcohol. She reports that she does not use drugs.  Allergies:  Allergies  Allergen Reactions  . Latex Rash     (Not in a hospital admission)  Prior to Admission medications   Medication Sig Start Date End Date Taking? Authorizing Provider  ADDERALL XR 30 MG 24 hr capsule Take 30 mg by mouth daily. 01/27/18  Yes [provider]  BIOTIN PO Take 1 tablet by mouth daily.   Yes  [provider]  sertraline (ZOLOFT) 25 MG tablet Take 1 tablet (25 mg total) by mouth daily. Patient not taking: Reported on 02/06/2018 05/13/17   Derrill Center, NP    Blood pressure (!) 116/59, pulse 68, temperature 98.6 F (37 C), temperature source Oral, resp. rate 16, height _0  (1.702 m), weight 65.8 kg, last menstrual period 01/28/2018, SpO2 100 %. Physical Exam: General: pleasant, WD/WN white female who is laying in bed in NAD HEENT: head is normocephalic, atraumatic.  Sclera are noninjected.  Pupils equal and round.  Ears and nose without any masses or lesions.  Mouth is pink and moist. Dentition fair Heart: regular, rate, and rhythm.  No obvious murmurs, gallops, or rubs noted.  Palpable pedal pulses bilaterally Lungs: CTAB, no wheezes, rhonchi, or rales noted.  Respiratory effort nonlabored Abd: soft, ND, +BS, no masses, hernias, or organomegaly. +TTP RLQ with voluntary guarding MS: all 4 extremities are symmetrical with no cyanosis, clubbing, or edema. Skin: warm and dry with no masses, lesions, or rashes Psych: A&Ox3 with an appropriate affect. Neuro: cranial nerves grossly intact, extremity CSM intact bilaterally, normal speech  Results for orders placed or performed during the hospital encounter of 02/06/18 (from the past 48 hour(s))  CBC with Differential     Status: Abnormal   Collection Time: 02/06/18  1:47 AM  Result Value Ref Range   WBC 14.0 (H) 4.0 - 10.5 K/uL   RBC 4.61 3.87 - 5.11 MIL/uL   Hemoglobin 14.0 12.0 - 15.0 g/dL   HCT 41.0 36.0 - 46.0 %   MCV  88.9 78.0 - 100.0 fL   MCH 30.4 26.0 - 34.0 pg   MCHC 34.1 30.0 - 36.0 g/dL   RDW 12.0 11.5 - 15.5 %   Platelets 210 150 - 400 K/uL   Neutrophils Relative % 74 %   Neutro Abs 10.4 (H) 1.7 - 7.7 K/uL   Lymphocytes Relative 20 %   Lymphs Abs 2.8 0.7 - 4.0 K/uL   Monocytes Relative 6 %   Monocytes Absolute 0.8 0.1 - 1.0 K/uL   Eosinophils Relative 0 %   Eosinophils Absolute 0.1 0.0 - 0.7 K/uL    Basophils Relative 0 %   Basophils Absolute 0.0 0.0 - 0.1 K/uL    Comment: Performed at [facility], [place] [address], [place], [place] [address]  Basic metabolic panel     Status: None   Collection Time: 02/06/18  1:47 AM  Result Value Ref Range   Sodium 140 135 - 145 mmol/L   Potassium 3.7 3.5 - 5.1 mmol/L   Chloride 103 98 - 111 mmol/L   CO2 26 22 - 32 mmol/L   Glucose, Bld 90 70 - 99 mg/dL   BUN 17 6 - 20 mg/dL   Creatinine, Ser 0.87 0.44 - 1.00 mg/dL   Calcium 9.4 8.9 - 10.3 mg/dL   GFR calc non Af Amer >60 >60 mL/min   GFR calc Af Amer >60 >60 mL/min    Comment: (NOTE) The eGFR has been calculated using the CKD EPI equation. This calculation has not been validated in all clinical situations. eGFR's persistently <60 mL/min signify possible Chronic Kidney Disease.    Anion gap 11 5 - 15    Comment: Performed at [facility], [place] [address] [place], [place] [address]  Lipase, blood     Status: None   Collection Time: 02/06/18  1:47 AM  Result Value Ref Range   Lipase 42 11 - 51 U/L    Comment: Performed at [facility], [place] [address] [place], [place] [address]  Hepatic function panel     Status: None   Collection Time: 02/06/18  1:47 AM  Result Value Ref Range   Total Protein 8.0 6.5 - 8.1 g/dL   Albumin 4.7 3.5 - 5.0 g/dL   AST 24 15 - 41 U/L   ALT 19 0 - 44 U/L   Alkaline Phosphatase 60 38 - 126 U/L   Total Bilirubin 0.8 0.3 - 1.2 mg/dL   Bilirubin, Direct 0.1 0.0 - 0.2 mg/dL   Indirect Bilirubin 0.7 0.3 - 0.9 mg/dL    Comment: Performed at [facility], [place] [address] [place], [place] [address]  I-Stat Beta hCG blood, ED ([facility], [facility], [facility] only)     Status: None   Collection Time: 02/06/18  1:52 AM  Result Value Ref Range   I-stat hCG, quantitative <5.0 <5 mIU/mL   Comment 3            Comment:   GEST. AGE      CONC.  (mIU/mL)   <=1 WEEK        5 - 50     2 WEEKS       50 - 500     3 WEEKS        100 - 10,000     4 WEEKS     1,000 - 30,000        FEMALE AND NON-PREGNANT FEMALE:     LESS THAN 5 mIU/mL  Ct Abdomen Pelvis W Contrast  Result Date: 02/06/2018 CLINICAL DATA:  Right lower quadrant abdominal pain. EXAM: CT ABDOMEN AND PELVIS WITH CONTRAST TECHNIQUE: Multidetector CT imaging of the abdomen and pelvis was performed using the standard protocol following bolus administration of intravenous contrast. CONTRAST:  144m ISOVUE-300 IOPAMIDOL (ISOVUE-300) INJECTION 61% COMPARISON:  None. FINDINGS: Lower chest: Lung bases are clear. Hepatobiliary: No focal liver abnormality is seen. No gallstones, gallbladder wall thickening, or biliary dilatation. Pancreas: Unremarkable. No pancreatic ductal dilatation or surrounding inflammatory changes. Spleen: Normal in size without focal abnormality. Adrenals/Urinary Tract: Adrenal glands are unremarkable. Kidneys are normal, without renal calculi, focal lesion, or hydronephrosis. Bladder is unremarkable. Stomach/Bowel: Stomach, small bowel, and colon are not abnormally distended. Dilated fluid-filled appendix with mucosal hyperemia and mild periappendiceal stranding suggesting early acute appendicitis. No abscess. Appendix: Location: Retrocecal and anterior to the psoas muscle. Diameter: 10 mm Appendicolith: No Mucosal hyper-enhancement: Yes Extraluminal gas: No Periappendiceal collection: No Vascular/Lymphatic: No significant vascular findings are present. No enlarged abdominal or pelvic lymph nodes. Reproductive: Uterus and bilateral adnexa are unremarkable. Other: No abdominal wall hernia or abnormality. No abdominopelvic ascites. Musculoskeletal: No acute or significant osseous findings. IMPRESSION: Findings consistent with early acute appendicitis. No evidence of appendiceal rupture or abscess. Electronically Signed   By: WLucienne CapersM.D.   On: 02/06/2018 03:00      Assessment/Plan ADHD Vapes  Acute appendicitis  - Patient with acute  appendicitis.   Will plan for laparoscopic appendectomy today, ERAS protocol. She already received rocephin/flagyl. Keep NPO. Depending on intra-operative findings patient may be able to be discharged this afternoon.  BWellington Hampshire [facility] 02/06/2018, 7:20 AM Pager: [phone]: [phone] Agree with above.  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  The primary risks of appendiceal surgery include, but are not limited to, bleeding, infection, bowel surgery, and open surgery.  There is also the risk that the patient may have continued symptoms after surgery.  We discussed the typical post-operative recovery course. I tried to answer the patient's questions.  DAlphonsa Overall MD, [facility]err[facility] Center[facility] Pager: [phone] phone:  [phone]

## 2018-02-06 NOTE — ED Provider Notes (Signed)
[facility]-EMERGENCY DEPT Provider Note   CSN: [id number] Arrival date & time: 02/06/18  0049     History   Chief Complaint Chief Complaint  Patient presents with  . Abdominal Pain    HPI Penny Mendoza is a 22 y.o. female.  The history is provided by the patient and medical records.  Abdominal Pain   Associated symptoms include nausea.     22 year old female with history of anxiety, presenting to the ED with abdominal pain.  States she woke up this morning was having some discomfort just above her navel, pain is been worsening throughout the day and now seems more localized to her right lower abdomen.  She reports nausea but denies vomiting.  Has also had poor appetite.  States she had a BM this morning which was normal, no change in symptoms.  She denies any urinary symptoms or pelvic pain.  No vaginal discharge.  She has not had any medications or symptoms.  No prior abdominal surgeries.  Past Medical History:  Diagnosis Date  . Anxiety     Patient Active Problem List   Diagnosis Date Noted  . Substance induced mood disorder (HCC) 05/10/2017  . Adjustment disorder with mixed anxiety and depressed mood 05/10/2017  . 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   OB History   None      Home Medications    Prior to Admission medications   Medication Sig Start Date End Date Taking? Authorizing Provider  sertraline (ZOLOFT) 25 MG tablet Take 1 tablet (25 mg total) by mouth daily. 05/13/17   Oneta Rack, NP    Family History No family history on file.  Social History Social History   Tobacco Use  . Smoking status: Never Smoker  . Smokeless tobacco: Never Used  Substance Use Topics  . Alcohol use: Yes    Comment: occassionaly   . Drug use: No     Allergies   Latex   Review of Systems Review of Systems  Gastrointestinal: Positive for  abdominal pain and nausea.  All other systems reviewed and are negative.    Physical Exam Updated Vital Signs BP 128/75 (BP Location: Left Arm)   Pulse 88   Temp 98.6 F (37 C) (Oral)   Resp 18   Ht 5\' 7"  (1.702 m)   Wt 65.8 kg   LMP 01/28/2018   SpO2 99%   BMI 22.71 kg/m   Physical Exam  Constitutional: She is oriented to person, place, and time. She appears well-developed and well-nourished.  HENT:  Head: Normocephalic and atraumatic.  Mouth/Throat: Oropharynx is clear and moist.  Eyes: Pupils are equal, round, and reactive to light. Conjunctivae and EOM are normal.  Neck: Normal range of motion.  Cardiovascular: Normal rate, regular rhythm and normal heart sounds.  Pulmonary/Chest: Effort normal and breath sounds normal.  Abdominal: Soft. Bowel sounds are normal. There is tenderness in the right lower quadrant.    Musculoskeletal: Normal range of motion.  Neurological: She is alert and oriented to person, place, and time.  Skin: Skin is warm and dry.  Psychiatric: She has a normal mood and affect.  Nursing note and vitals reviewed.    ED Treatments / Results  Labs (all labs ordered are listed, but only abnormal results are displayed) Labs Reviewed  CBC WITH DIFFERENTIAL/PLATELET - Abnormal; Notable for the following components:      Result Value  WBC 14.0 (*)    Neutro Abs 10.4 (*)    All other components within normal limits  BASIC METABOLIC PANEL  LIPASE, BLOOD  HEPATIC FUNCTION PANEL  I-STAT BETA HCG BLOOD, ED ([facility], [facility], [facility] ONLY)    EKG None  Radiology Ct Abdomen Pelvis W Contrast  Result Date: 02/06/2018 CLINICAL DATA:  Right lower quadrant abdominal pain. EXAM: CT ABDOMEN AND PELVIS WITH CONTRAST TECHNIQUE: Multidetector CT imaging of the abdomen and pelvis was performed using the standard protocol following bolus administration of intravenous contrast. CONTRAST:  ISOVUE-300 IOPAMIDOL (ISOVUE-300) INJECTION 61% COMPARISON:  None. FINDINGS: Lower  chest: Lung bases are clear. Hepatobiliary: No focal liver abnormality is seen. No gallstones, gallbladder wall thickening, or biliary dilatation. Pancreas: Unremarkable. No pancreatic ductal dilatation or surrounding inflammatory changes. Spleen: Normal in size without focal abnormality. Adrenals/Urinary Tract: Adrenal glands are unremarkable. Kidneys are normal, without renal calculi, focal lesion, or hydronephrosis. Bladder is unremarkable. Stomach/Bowel: Stomach, small bowel, and colon are not abnormally distended. Dilated fluid-filled appendix with mucosal hyperemia and mild periappendiceal stranding suggesting early acute appendicitis. No abscess. Appendix: Location: Retrocecal and anterior to the psoas muscle. Diameter: 10 mm Appendicolith: No Mucosal hyper-enhancement: Yes Extraluminal gas: No Periappendiceal collection: No Vascular/Lymphatic: No significant vascular findings are present. No enlarged abdominal or pelvic lymph nodes. Reproductive: Uterus and bilateral adnexa are unremarkable. Other: No abdominal wall hernia or abnormality. No abdominopelvic ascites. Musculoskeletal: No acute or significant osseous findings. IMPRESSION: Findings consistent with early acute appendicitis. No evidence of appendiceal rupture or abscess. Electronically Signed   By: Burman Nieves M.D.   On: 02/06/2018 03:00    Procedures Procedures (including critical care time)  Medications Ordered in ED Medications  iopamidol (ISOVUE-300) 61 % injection (has no administration in time range)  cefTRIAXone (ROCEPHIN) 2 g in sodium chloride 0.9 % 100 mL IVPB (2 g Intravenous New Bag/Given 02/06/18 0326)    And  metroNIDAZOLE (FLAGYL) IVPB 500 mg (500 mg Intravenous New Bag/Given 02/06/18 0405)  0.9 %  sodium chloride infusion ( Intravenous New Bag/Given 02/06/18 0328)  ondansetron (ZOFRAN) injection 4 mg (4 mg Intravenous Given 02/06/18 0146)  morphine 4 MG/ML injection 4 mg (4 mg Intravenous Given 02/06/18 0146)  iopamidol  (ISOVUE-300) 61 % injection 100 mL (100 mLs Intravenous Contrast Given 02/06/18 0242)  HYDROmorphone (DILAUDID) injection 1 mg (1 mg Intravenous Given 02/06/18 0325)     Initial Impression / Assessment and Plan / ED Course  I have reviewed the triage vital signs and the nursing notes.  Pertinent labs & imaging results that were available during my care of the patient were reviewed by me and considered in my medical decision making (see chart for details).  22 year old female here with abdominal pain.  Began this morning around her navel, has since worsened and migrated to right lower abdomen.  She is afebrile and nontoxic, does appear somewhat uncomfortable.  Clinical concern for appendicitis.  Labs and CT scan ordered.  Pain and nausea medications ordered.  Labs with white count of 14,000, otherwise reassuring.  CT scan confirms early acute appendicitis without abscess or perforation.  Patient and mother at bedside updated.  Pain has returned, additional medications ordered.  Have ordered preop antibiotics.  Will consult with general surgery.  Discussed with Dr. Dwain Sarna-- surgery this AM. Remain NPO.  Will see this morning.  Temp admit orders placed at his request.  Final Clinical Impressions(s) / ED Diagnoses   Final diagnoses:  Other acute appendicitis    ED Discharge  Orders    None       Garlon Hatchet, PA-C 02/06/18 0411    Derwood Kaplan, MD 02/06/18 [phone]

## 2018-02-06 NOTE — Anesthesia Postprocedure Evaluation (Signed)
Anesthesia Post Note  Patient: Penny Mendoza  Procedure(s) Performed: APPENDECTOMY LAPAROSCOPIC (N/A )     Patient location during evaluation: PACU Anesthesia Type: General Level of consciousness: awake and alert Pain management: pain level controlled Vital Signs Assessment: post-procedure vital signs reviewed and stable Respiratory status: spontaneous breathing, nonlabored ventilation, respiratory function stable and patient connected to nasal cannula oxygen Cardiovascular status: blood pressure returned to baseline and stable Postop Assessment: no apparent nausea or vomiting Anesthetic complications: no    Last Vitals:  Vitals:   02/06/18 1342 02/06/18 1431  BP: 122/78 (!) 113/50  Pulse: 63 63  Resp: 15 16  Temp: 36.7 C 36.5 C  SpO2: 100% 100%    Last Pain:  Vitals:   02/06/18 1431  TempSrc: Oral  PainSc:                  Tangela Dolliver S

## 2018-02-06 NOTE — ED Notes (Signed)
Consent signed and placed at bedside. Pain currently 5/10, family at bedside. Patient states she does not need pain medication at this time. Will continue to monitor, plan of care reviewed with patient.

## 2018-02-06 NOTE — Anesthesia Procedure Notes (Signed)
Performed by: Orest Dikes, CRNA Airway Equipment and Method: Oral airway

## 2018-02-06 NOTE — Anesthesia Preprocedure Evaluation (Signed)
Anesthesia Evaluation  Patient identified by MRN, date of birth, ID band Patient awake    Reviewed: Allergy & Precautions, H&P , NPO status , Patient's Chart, lab work & pertinent test results  Airway Mallampati: II   Neck ROM: full    Dental   Pulmonary neg pulmonary ROS,    breath sounds clear to auscultation       Cardiovascular negative cardio ROS   Rhythm:regular Rate:Normal     Neuro/Psych PSYCHIATRIC DISORDERS Anxiety Depression    GI/Hepatic   Endo/Other    Renal/GU      Musculoskeletal   Abdominal   Peds  Hematology   Anesthesia Other Findings   Reproductive/Obstetrics                             Anesthesia Physical Anesthesia Plan  ASA: II  Anesthesia Plan: General   Post-op Pain Management:    Induction: Intravenous  PONV Risk Score and Plan: 3 and Ondansetron, Dexamethasone, Midazolam and Treatment may vary due to age or medical condition  Airway Management Planned: Oral ETT  Additional Equipment:   Intra-op Plan:   Post-operative Plan: Extubation in OR  Informed Consent: I have reviewed the patients History and Physical, chart, labs and discussed the procedure including the risks, benefits and alternatives for the proposed anesthesia with the patient or authorized representative who has indicated his/her understanding and acceptance.       Plan Discussed with: CRNA, Anesthesiologist and Surgeon  Anesthesia Plan Comments:         Anesthesia Quick Evaluation  

## 2018-02-06 NOTE — Progress Notes (Signed)
Nipple rings removed from pt. Given to aunt.

## 2018-02-06 NOTE — Transfer of Care (Signed)
Immediate Anesthesia Transfer of Care Note  Patient: Penny Mendoza  Procedure(s) Performed: APPENDECTOMY LAPAROSCOPIC (N/A )  Patient Location: PACU  Anesthesia Type:General  Level of Consciousness: awake, alert  and oriented  Airway & Oxygen Therapy: Patient Spontanous Breathing and Patient connected to face mask oxygen  Post-op Assessment: Report given to RN and Post -op Vital signs reviewed and stable  Post vital signs: Reviewed and stable  Last Vitals:  Vitals Value Taken Time  BP 130/71 02/06/2018 11:37 AM  Temp    Pulse 87 02/06/2018 11:39 AM  Resp 16 02/06/2018 11:39 AM  SpO2 98 % 02/06/2018 11:39 AM  Vitals shown include unvalidated device data.  Last Pain:  Vitals:   02/06/18 0823  TempSrc:   PainSc: 5          Complications: No apparent anesthesia complications

## 2018-02-06 NOTE — Anesthesia Procedure Notes (Signed)
Procedure Name: Intubation Date/Time: 02/06/2018 10:23 AM Performed by: Maxwell Caul, CRNA Pre-anesthesia Checklist: Patient identified, Emergency Drugs available, Suction available and Patient being monitored Patient Re-evaluated:Patient Re-evaluated prior to induction Oxygen Delivery Method: Circle system utilized Preoxygenation: Pre-oxygenation with 100% oxygen Induction Type: IV induction, Rapid sequence and Cricoid Pressure applied Laryngoscope Size: Mac and 4 Grade View: Grade I Tube type: Oral Tube size: 7.5 mm Number of attempts: 1 Airway Equipment and Method: Stylet Placement Confirmation: ETT inserted through vocal cords under direct vision,  positive ETCO2 and breath sounds checked- equal and bilateral Secured at: 21 cm Tube secured with: Tape Dental Injury: Teeth and Oropharynx as per pre-operative assessment

## 2018-02-06 NOTE — Op Note (Signed)
Re:   Penny Mendoza DOB:   September 23, 1995 MRN:   [id number]                   FACILITY:  [facility]  DATE OF PROCEDURE: 02/06/2018                              OPERATIVE REPORT  PREOPERATIVE DIAGNOSIS:  Appendicitis  POSTOPERATIVE DIAGNOSIS:  Acute suppurative appendicitis.  PROCEDURE:  Laparoscopic appendectomy.  SURGEON:  Sandria Bales. Ezzard Standing, MD  ASSISTANT:  No first assistant.  ANESTHESIA:  General endotracheal.  Anesthesiologist: Achille Rich, MD CRNA: Minerva Ends, CRNA; Orest Dikes, CRNA  ASA:  2E  ESTIMATED BLOOD LOSS:  Minimal.  DRAINS: none   SPECIMEN:   Appendix  COUNTS CORRECT:  YES  INDICATIONS FOR PROCEDURE: [name] is a 22 y.o. (DOB: Mar 01, 1996) white female whose primary care doctor is Scifres, Nicole Cella, PA-C and comes to the OR for an appendectomy.   I discussed with the patient, the indications and potential complications of appendiceal surgery.  The potential complications include, but are not limited to, bleeding, open surgery, bowel resection, and the possibility of another diagnosis.  OPERATIVE NOTE:  The patient underwent a general endotracheal anesthetic as supervised by Anesthesiologist: Achille Rich, MD CRNA: Minerva Ends, CRNA; Orest Dikes, CRNA, General, in [facility] OR room # 2.  The patient was given Rocephin and Flagyl prior to the beginning of the procedure and the abdomen was prepped with ChloraPrep.   A time-out was held and surgical checklist run.  An infraumbilical incision was made with sharp dissection carried down to the abdominal cavity.  An 12 mm Hasson trocar was inserted through the infraumbilical incision and into the peritoneal cavity.  A 30 degree 5 mm laparoscope was inserted through a 12 mm Hasson trocar and the Hasson trocar secured with a 0 Vicryl suture.  I placed a 5 mm trocar in the right upper quadrant and a 5 mm torcar in left lower quadrant and did abdominal exploration.    The right and left  lobes of liver unremarkable.  Stomach was unremarkable.  The pelvic organs were unremarkable.  I saw no other intra-abdominal abnormality.  The patient had mildly purulent appendicitis with the appendix located at the right pelvic brim.  The appendix was not ruptured.  The mesentery of the appendix was divided with a Harmonic scalpel.  I got to the base of the appendix.  I then used a blue load 45 mm Ethicon Endo-GIA stapler and fired this across the base of the appendix.  I placed the appendix in EndoCatch bag and delivered the bag through the umbilical incision.  I irrigated the abdomen with 800 cc of saline.  After irrigating the abdomen, I then removed the trocars, in turn.  The umbilical port fascia was closed with 0 Vicryl suture.   I closed the skin each site with a 4-0 Monocryl suture and painted the wounds with DermaBond.  I then injected a total of 30 mL of 0.25% Marcaine at the incisions.  Sponge and needle count were correct at the end of the case.  The patient was transferred to the recovery room in good condition.  The patient tolerated the procedure well and it depends on the patient's post op clinical course as to when the patient could be discharged.   Ovidio Kin, MD, [facility] Surgery Pager: [phone] Office phone:  [phone]      

## 2018-02-06 NOTE — Discharge Instructions (Addendum)
Please arrive at least 30 min before your appointment to complete your check in paperwork.  If you are unable to arrive 30 min prior to your appointment time we may have to cancel or reschedule you. ° °LAPAROSCOPIC SURGERY: POST OP INSTRUCTIONS  °1. DIET: Follow a light bland diet the first 24 hours after arrival home, such as soup, liquids, crackers, etc. Be sure to include lots of fluids daily. Avoid fast food or heavy meals as your are more likely to get nauseated. Eat a low fat the next few days after surgery.  °2. Take your usually prescribed home medications unless otherwise directed. °3. PAIN CONTROL:  °1. Pain is best controlled by a usual combination of three different methods TOGETHER:  °1. Ice/Heat °2. Over the counter pain medication °3. Prescription pain medication °2. Most patients will experience some swelling and bruising around the incisions. Ice packs or heating pads (30-60 minutes up to 6 times a day) will help. Use ice for the first few days to help decrease swelling and bruising, then switch to heat to help relax tight/sore spots and speed recovery. Some people prefer to use ice alone, heat alone, alternating between ice & heat. Experiment to what works for you. Swelling and bruising can take several weeks to resolve.  °3. It is helpful to take an over-the-counter pain medication regularly for the first few weeks. Choose one of the following that works best for you:  °1. Naproxen (Aleve, etc) Two 220mg tabs twice a day °2. Ibuprofen (Advil, etc) Three 200mg tabs four times a day (every meal & bedtime) °3. Acetaminophen (Tylenol, etc) 500-650mg four times a day (every meal & bedtime) °4. A prescription for pain medication (such as oxycodone, hydrocodone, etc) should be given to you upon discharge. Take your pain medication as prescribed.  °1. If you are having problems/concerns with the prescription medicine (does not control pain, nausea, vomiting, rash, itching, etc), please call us [phone] to see if we need to switch you to a different pain medicine that will work better for you and/or control your side effect better. °2. If you need a refill on your pain medication, please contact your pharmacy. They will contact our office to request authorization. Prescriptions will not be filled after 5 pm or on week-ends. °4. Avoid getting constipated. Between the surgery and the pain medications, it is common to experience some constipation. Increasing fluid intake and taking a fiber supplement (such as Metamucil, Citrucel, FiberCon, MiraLax, etc) 1-2 times a day regularly will usually help prevent this problem from occurring. A mild laxative (prune juice, Milk of Magnesia, MiraLax, etc) should be taken according to package directions if there are no bowel movements after 48 hours.  °5. Watch out for diarrhea. If you have many loose bowel movements, simplify your diet to bland foods & liquids for a few days. Stop any stool softeners and decrease your fiber supplement. Switching to mild anti-diarrheal medications (Kayopectate, Pepto Bismol) can help. If this worsens or does not improve, please call us. °6. Wash / shower every day. You may shower over the dressings as they are waterproof. Continue to shower over incision(s) after the dressing is off. If there is glue over the incisions try not to pick it off, let it fall off naturally. °7. Remove your waterproof bandages 2 days after surgery. You may leave the incision open to air. You may replace a dressing/Band-Aid to cover the incision for comfort if you wish.  °8. ACTIVITIES as tolerated:  °  1. You may resume regular (light) daily activities beginning the next day--such as daily self-care, walking, climbing stairs--gradually increasing activities as tolerated. If you can walk 30 minutes without difficulty, it is safe to try more intense activity such as jogging, treadmill, bicycling, low-impact aerobics, swimming, etc. 2. Save the most intensive and  strenuous activity for last such as sit-ups, heavy lifting, contact sports, etc Refrain from any heavy lifting or straining until you are off narcotics for pain control. For the first 2-3 weeks do not lift over 10-15lb.  3. DO NOT PUSH THROUGH PAIN. Let pain be your guide: If it hurts to do something, don't do it. Pain is your body warning you to avoid that activity for another week until the pain goes down. 4. You may drive when you are no longer taking prescription pain medication, you can comfortably wear a seatbelt, and you can safely maneuver your car and apply brakes. 5. You may have sexual intercourse when it is comfortable.  9. FOLLOW UP in our office  1. Please call [facility] at [phone] to set up an appointment to see your surgeon in the office for a follow-up appointment approximately 2-3 weeks after your surgery. 2. Make sure that you call for this appointment the day you arrive home to insure a convenient appointment time.      10. IF YOU HAVE DISABILITY OR FAMILY LEAVE FORMS, BRING THEM TO THE               OFFICE FOR PROCESSING.   WHEN TO CALL us [phone]:  1. Poor pain control 2. Reactions / problems with new medications (rash/itching, nausea, etc)  3. Fever over 101.5 F (38.5 C) 4. Inability to urinate 5. Nausea and/or vomiting 6. Worsening swelling or bruising 7. Continued bleeding from incision. 8. Increased pain, redness, or drainage from the incision  The clinic staff is available to answer your questions during regular business hours (8:30am-5pm). Please dont hesitate to call and ask to speak to one of our nurses for clinical concerns.  If you have a medical emergency, go to the nearest emergency room or call 911.  A surgeon from [facility] is always on call at the [facility], [place]  [address], [place], [place] [address] ?  MAIN: [phone] ? TOLL FREE: [phone] ?  FAX [phone]    [email or web address]    Appendicitis The appendix is a finger-shaped tube that is attached to the large intestine. Appendicitis is inflammation of the appendix. Without treatment, appendicitis can cause the appendix to tear (rupture). A ruptured appendix can lead to a life-threatening infection. It can also lead to the formation of a painful collection of pus (abscess) in the appendix. What are the causes? This condition may be caused by a blockage in the appendix that leads to infection. The blockage can be due to:  A ball of stool.  Enlarged lymph glands.  In some cases, the cause may not be known. What increases the risk? This condition is more likely to develop in people who are 65-6 years of age. What are the signs or symptoms? Symptoms of this condition include:  Pain around the belly button that moves toward the lower right abdomen. The pain can become more severe as time passes. It gets worse with coughing or sudden movements.  Tenderness in the lower right abdomen.  Nausea.  Vomiting.  Loss of appetite.  Fever.  Constipation.  Diarrhea.  Generally not feeling well.  How is this diagnosed? This condition may be diagnosed with:  A physical exam.  Blood tests.  Urine test.  To confirm the diagnosis, an ultrasound, MRI, or CT scan may be done. How is this treated? This condition is usually treated by taking out the appendix (appendectomy). There are two methods for doing an appendectomy:  Open appendectomy. In this surgery, the appendix is removed through a large cut (incision) that is made in the lower right abdomen. This procedure may be recommended if: ? You have major scarring from a previous surgery. ? You have a bleeding disorder. ? You are pregnant and are near term. ? You have a condition that makes the laparoscopic procedure impossible, such as an advanced infection or a ruptured appendix.  Laparoscopic appendectomy. In this surgery,  the appendix is removed through small incisions. This procedure usually causes less pain and fewer problems than an open appendectomy. It also has a shorter recovery time.  If the appendix has ruptured and an abscess has formed, a drain may be placed into the abscess to remove fluid and antibiotic medicines may be given through an IV tube. The appendix may or may not need to be removed. This information is not intended to replace advice given to you by your health care provider. Make sure you discuss any questions you have with your health care provider. Document Released: 05/20/2005 Document Revised: 09/27/2015 Document Reviewed: 10/05/2014 Elsevier Interactive Patient Education  2017 [employer].

## 2018-02-06 NOTE — ED Triage Notes (Signed)
Pt arriving with abdominal pain that began last night. Pt reports pain has only increased since onset of symptoms. Pt reports nausea but no vomiting

## 2018-02-07 ENCOUNTER — Encounter (HOSPITAL_COMMUNITY): Payer: Self-pay | Admitting: Surgery

## 2018-02-07 MED ORDER — NAPROXEN 500 MG PO TABS: 500.0000 mg | ORAL_TABLET | Freq: Two times a day (BID) | ORAL | 1 refills | Status: AC | PRN

## 2018-02-07 MED ORDER — FLUCONAZOLE 200 MG PO TABS: 200.0000 mg | ORAL_TABLET | Freq: Every day | ORAL | 2 refills | Status: AC

## 2018-02-07 MED ORDER — AMOXICILLIN-POT CLAVULANATE 875-125 MG PO TABS: 1.0000 | ORAL_TABLET | Freq: Two times a day (BID) | ORAL | 1 refills | Status: AC

## 2018-02-07 MED ORDER — TRAMADOL HCL 50 MG PO TABS: 50.0000 mg | ORAL_TABLET | Freq: Four times a day (QID) | ORAL | 0 refills | Status: AC | PRN

## 2018-02-07 NOTE — Discharge Summary (Signed)
Physician Discharge Summary    Patient ID: Penny Mendoza MRN: [id number] DOB/AGE: [birth year]  22 y.o.  Admit date: 02/06/2018 Discharge date: 02/07/2018   Hospital Stay = 1 days  Patient Care Team: Scifres, Durel Salts as PCP - General (Physician Assistant)  Discharge Diagnoses:  Principal Problem:   Acute suppurative appendicitis s/p lap appendectomy 02/06/2018   1 Day Post-Op  02/06/2018  POST-OPERATIVE DIAGNOSIS:   appendicitis  SURGERY:  02/06/2018  Procedure(s): APPENDECTOMY LAPAROSCOPIC  SURGEON:    Surgeon(s): Alphonsa Overall, MD  Consults: None  Hospital Course:   The patient had history and physical and CT scan suspicious for appendicitis.  Underwent emergent appendectomy.  Postoperatively, the patient gradually mobilized and advanced to a solid diet.  Pain and other symptoms were treated aggressively.    By the time of discharge, the patient was walking well the hallways, eating food, having flatus.  Pain was well-controlled on an oral medications.  Based on meeting discharge criteria and continuing to recover, I felt it was safe for the patient to be discharged from the hospital to further recover with close followup. I discussed operative findings, updated the patient's status, discussed probable steps to recovery, and gave postoperative recommendations to the patient and family.  Recommendations were made.  Questions were answered.  They  expressed understanding & appreciation.  Discharged Condition: good  Disposition:  [facility], [place]. Go on 02/19/2018.   Specialty:  General Surgery Why:  Your appointment is 09/19 at 1:45 pm Please arrive 30 minutes prior to your appointment to check in and fill out paperwork. Bring photo ID and insurance information. Contact information: [address] [place] [phone]          Discharge disposition: 01-Home or Self  Care       Discharge Instructions    Call MD for:   Complete by:  As directed    FEVER > 101.5 F  (temperatures < 101.5 F are not significant)   Call MD for:  extreme fatigue   Complete by:  As directed    Call MD for:  persistant dizziness or light-headedness   Complete by:  As directed    Call MD for:  persistant nausea and vomiting   Complete by:  As directed    Call MD for:  redness, tenderness, or signs of infection (pain, swelling, redness, odor or green/yellow discharge around incision site)   Complete by:  As directed    Call MD for:  severe uncontrolled pain   Complete by:  As directed    Diet - low sodium heart healthy   Complete by:  As directed    Start with a bland diet such as soups, liquids, starchy foods, low fat foods, etc. the first few days at home. Gradually advance to a solid, low-fat, high fiber diet by the end of the first week at home.   Add a fiber supplement to your diet (Metamucil, etc) If you feel full, bloated, or constipated, stay on a full liquid or pureed/blenderized diet for a few days until you feel better and are no longer constipated.   Discharge instructions   Complete by:  As directed    See Discharge Instructions If you are not getting better after two weeks or are noticing you are getting worse, contact our office [phone] for further advice.  We may need to adjust your medications, re-evaluate you in the office, send you to  the emergency room, or see what other things we can do to help. The clinic staff is available to answer your questions during regular business hours (8:30am-5pm).  Please don't hesitate to call and ask to speak to one of our nurses for clinical concerns.    A surgeon from [facility] is always on call at the hospitals 24 hours/day If you have a medical emergency, go to the nearest emergency room or call 911.   Discharge wound care:   Complete by:  As directed    It is good for closed incisions and  even open wounds to be washed every day.  Shower every day.  Short baths are fine.  Wash the incisions and wounds clean with soap & water.     You may leave closed incisions open to air if it is dry.   You may cover the incision with clean gauze & replace it after your daily shower for comfort.   If you have skin tapes (Steristrips) or skin glue (Dermabond) on your incision, leave them in place.  They will fall off on their own like a scab.  You may trim any edges that curl up with clean scissors.    If you have skin staples, set up an appointment for them to be removed in the office in 10 days after surgery.  If you have a drain, wash around the skin exit site with soap & water and place a new dressing of gauze or band aid around the skin every day.  Keep the drain site clean & dry.   Driving Restrictions   Complete by:  As directed    You may drive when: - you are no longer taking narcotic prescription pain medication - you can comfortably wear a seatbelt - you can safely make sudden turns/stops without pain.   Increase activity slowly   Complete by:  As directed    Start light daily activities --- self-care, walking, climbing stairs- beginning the day after surgery.  Gradually increase activities as tolerated.  Control your pain to be active.  Stop when you are tired.  Ideally, walk several times a day, eventually an hour a day.   Most people are back to most day-to-day activities in a few weeks.  It takes 4-6 weeks to get back to unrestricted, intense activity. If you can walk 30 minutes without difficulty, it is safe to try more intense activity such as jogging, treadmill, bicycling, low-impact aerobics, swimming, etc. Save the most intensive and strenuous activity for last (Usually 4-8 weeks after surgery) such as sit-ups, heavy lifting, contact sports, etc.  Refrain from any intense heavy lifting or straining until you are off narcotics for pain control.  You will have off days, but things  should improve week-by-week. DO NOT PUSH THROUGH PAIN.  Let pain be your guide: If it hurts to do something, don't do it.   Lifting restrictions   Complete by:  As directed    If you can walk 30 minutes without difficulty, it is safe to try more intense activity such as jogging, treadmill, bicycling, low-impact aerobics, swimming, etc. Save the most intensive and strenuous activity for last (Usually 4-8 weeks after surgery) such as sit-ups, heavy lifting, contact sports, etc.   Refrain from any intense heavy lifting or straining until you are off narcotics for pain control.  You will have off days, but things should improve week-by-week. DO NOT PUSH THROUGH PAIN.  Let pain be your guide: If it hurts  to do something, don't do it.  Pain is your body warning you to avoid that activity for another week until the pain goes down.   May shower / Bathe   Complete by:  As directed    May walk up steps   Complete by:  As directed    Sexual Activity Restrictions   Complete by:  As directed    You may have sexual intercourse when it is comfortable. If it hurts to do something, stop.      Allergies as of 02/07/2018      Reactions   Latex Rash      Medication List    STOP taking these medications   sertraline 25 MG tablet Commonly known as:  ZOLOFT     TAKE these medications   ADDERALL XR 30 MG 24 hr capsule Generic drug:  amphetamine-dextroamphetamine Take 30 mg by mouth daily.   amoxicillin-clavulanate 875-125 MG tablet Commonly known as:  AUGMENTIN Take 1 tablet by mouth 2 (two) times daily.   BIOTIN PO Take 1 tablet by mouth daily.   fluconazole 200 MG tablet Commonly known as:  DIFLUCAN Take 1 tablet (200 mg total) by mouth daily.   naproxen 500 MG tablet Commonly known as:  NAPROSYN Take 1 tablet (500 mg total) by mouth every 12 (twelve) hours as needed for mild pain or moderate pain.   traMADol 50 MG tablet Commonly known as:  ULTRAM Take 1-2 tablets (50-100 mg total) by  mouth every 6 (six) hours as needed for moderate pain or severe pain.            Discharge Care Instructions  (From admission, onward)         Start     Ordered   02/07/18 0000  Discharge wound care:    Comments:  It is good for closed incisions and even open wounds to be washed every day.  Shower every day.  Short baths are fine.  Wash the incisions and wounds clean with soap & water.     You may leave closed incisions open to air if it is dry.   You may cover the incision with clean gauze & replace it after your daily shower for comfort.   If you have skin tapes (Steristrips) or skin glue (Dermabond) on your incision, leave them in place.  They will fall off on their own like a scab.  You may trim any edges that curl up with clean scissors.    If you have skin staples, set up an appointment for them to be removed in the office in 10 days after surgery.  If you have a drain, wash around the skin exit site with soap & water and place a new dressing of gauze or band aid around the skin every day.  Keep the drain site clean & dry.   02/07/18 0939          Significant Diagnostic Studies:  Results for orders placed or performed during the hospital encounter of 02/06/18 (from the past 72 hour(s))  CBC with Differential     Status: Abnormal   Collection Time: 02/06/18  1:47 AM  Result Value Ref Range   WBC 14.0 (H) 4.0 - 10.5 K/uL   RBC 4.61 3.87 - 5.11 MIL/uL   Hemoglobin 14.0 12.0 - 15.0 g/dL   HCT 41.0 36.0 - 46.0 %   MCV 88.9 78.0 - 100.0 fL   MCH 30.4 26.0 - 34.0 pg   MCHC 34.1 30.0 -  36.0 g/dL   RDW 12.0 11.5 - 15.5 %   Platelets 210 150 - 400 K/uL   Neutrophils Relative % 74 %   Neutro Abs 10.4 (H) 1.7 - 7.7 K/uL   Lymphocytes Relative 20 %   Lymphs Abs 2.8 0.7 - 4.0 K/uL   Monocytes Relative 6 %   Monocytes Absolute 0.8 0.1 - 1.0 K/uL   Eosinophils Relative 0 %   Eosinophils Absolute 0.1 0.0 - 0.7 K/uL   Basophils Relative 0 %   Basophils Absolute 0.0 0.0 - 0.1  K/uL    Comment: Performed at [facility], [place] [address] [place], [place] [address]  Basic metabolic panel     Status: None   Collection Time: 02/06/18  1:47 AM  Result Value Ref Range   Sodium 140 135 - 145 mmol/L   Potassium 3.7 3.5 - 5.1 mmol/L   Chloride 103 98 - 111 mmol/L   CO2 26 22 - 32 mmol/L   Glucose, Bld 90 70 - 99 mg/dL   BUN 17 6 - 20 mg/dL   Creatinine, Ser 0.87 0.44 - 1.00 mg/dL   Calcium 9.4 8.9 - 10.3 mg/dL   GFR calc non Af Amer >60 >60 mL/min   GFR calc Af Amer >60 >60 mL/min    Comment: (NOTE) The eGFR has been calculated using the CKD EPI equation. This calculation has not been validated in all clinical situations. eGFR's persistently <60 mL/min signify possible Chronic Kidney Disease.    Anion gap 11 5 - 15    Comment: Performed at [facility], [place] [address], [place], [place] [address]  Lipase, blood     Status: None   Collection Time: 02/06/18  1:47 AM  Result Value Ref Range   Lipase 42 11 - 51 U/L    Comment: Performed at [facility], [place] [address], [place], [place] [address]  Hepatic function panel     Status: None   Collection Time: 02/06/18  1:47 AM  Result Value Ref Range   Total Protein 8.0 6.5 - 8.1 g/dL   Albumin 4.7 3.5 - 5.0 g/dL   AST 24 15 - 41 U/L   ALT 19 0 - 44 U/L   Alkaline Phosphatase 60 38 - 126 U/L   Total Bilirubin 0.8 0.3 - 1.2 mg/dL   Bilirubin, Direct 0.1 0.0 - 0.2 mg/dL   Indirect Bilirubin 0.7 0.3 - 0.9 mg/dL    Comment: Performed at [facility], [place] [address] [place], [place] [address]  I-Stat Beta hCG blood, ED ([facility], [facility], [facility] only)     Status: None   Collection Time: 02/06/18  1:52 AM  Result Value Ref Range   I-stat hCG, quantitative <5.0 <5 mIU/mL   Comment 3            Comment:   GEST. AGE      CONC.  (mIU/mL)   <=1 WEEK        5 - 50     2 WEEKS       50 - 500     3 WEEKS       100 - 10,000     4 WEEKS     1,000 - 30,000         FEMALE AND NON-PREGNANT FEMALE:     LESS THAN 5 mIU/mL     Ct Abdomen Pelvis W Contrast  Result Date: 02/06/2018 CLINICAL DATA:  Right lower quadrant abdominal pain.  EXAM: CT ABDOMEN AND PELVIS WITH CONTRAST TECHNIQUE: Multidetector CT imaging of the abdomen and pelvis was performed using the standard protocol following bolus administration of intravenous contrast. CONTRAST:  186m ISOVUE-300 IOPAMIDOL (ISOVUE-300) INJECTION 61% COMPARISON:  None. FINDINGS: Lower chest: Lung bases are clear. Hepatobiliary: No focal liver abnormality is seen. No gallstones, gallbladder wall thickening, or biliary dilatation. Pancreas: Unremarkable. No pancreatic ductal dilatation or surrounding inflammatory changes. Spleen: Normal in size without focal abnormality. Adrenals/Urinary Tract: Adrenal glands are unremarkable. Kidneys are normal, without renal calculi, focal lesion, or hydronephrosis. Bladder is unremarkable. Stomach/Bowel: Stomach, small bowel, and colon are not abnormally distended. Dilated fluid-filled appendix with mucosal hyperemia and mild periappendiceal stranding suggesting early acute appendicitis. No abscess. Appendix: Location: Retrocecal and anterior to the psoas muscle. Diameter: 10 mm Appendicolith: No Mucosal hyper-enhancement: Yes Extraluminal gas: No Periappendiceal collection: No Vascular/Lymphatic: No significant vascular findings are present. No enlarged abdominal or pelvic lymph nodes. Reproductive: Uterus and bilateral adnexa are unremarkable. Other: No abdominal wall hernia or abnormality. No abdominopelvic ascites. Musculoskeletal: No acute or significant osseous findings. IMPRESSION: Findings consistent with early acute appendicitis. No evidence of appendiceal rupture or abscess. Electronically Signed   By: WLucienne CapersM.D.   On: 02/06/2018 03:00    Discharge Exam: Blood pressure 117/63, pulse (!) 52, temperature 98.3 F (36.8 C), temperature source Oral, resp. rate 18, height 5' 7"  (1.702 m), weight 65.8 kg, last menstrual period 01/28/2018, SpO2 100 %.  General: Pt awake/alert/oriented x4 in No acute distress Eyes: PERRL, normal EOM.  Sclera clear.  No icterus Neuro: CN II-XII intact w/o focal sensory/motor deficits. Lymph: No head/neck/groin lymphadenopathy Psych:  No delerium/psychosis/paranoia HENT: Normocephalic, Mucus membranes moist.  No thrush Neck: Supple, No tracheal deviation Chest: No chest wall pain w good excursion CV:  Pulses intact.  Regular rhythm MS: Normal AROM mjr joints.  No obvious deformity Abdomen: Soft.  Nondistended.  Mildly tender at incisions only.  No evidence of peritonitis.  No incarcerated hernias. Ext:  SCDs BLE.  No mjr edema.  No cyanosis Skin: No petechiae / purpura  Past Medical History:  Diagnosis Date  . Anxiety     Past Surgical History:  Procedure Laterality Date  . LAPAROSCOPIC APPENDECTOMY N/A 02/06/2018   Procedure: APPENDECTOMY LAPAROSCOPIC;  Surgeon: NAlphonsa Overall MD;  Location: [facility];  Service: General;  Laterality: N/A;    Social History   Socioeconomic History  . Marital status: Single    Spouse name: Not on file  . Number of children: Not on file  . Years of education: Not on file  . Highest education level: Not on file  Occupational History  . Not on file  Social Needs  . Financial resource strain: Not on file  . Food insecurity:    Worry: Not on file    Inability: Not on file  . Transportation needs:    Medical: Not on file    Non-medical: Not on file  Tobacco Use  . Smoking status: Never Smoker  . Smokeless tobacco: Never Used  Substance and Sexual Activity  . Alcohol use: Yes    Comment: occassionaly   . Drug use: No  . Sexual activity: Yes    Birth control/protection: Condom  Lifestyle  . Physical activity:    Days per week: Not on file    Minutes per session: Not on file  . Stress: Not on file  Relationships  . Social connections:    Talks on phone: Not on file  Gets  together: Not on file    Attends religious service: Not on file    Active member of club or organization: Not on file    Attends meetings of clubs or organizations: Not on file    Relationship status: Not on file  . Intimate partner violence:    Fear of current or ex partner: Not on file    Emotionally abused: Not on file    Physically abused: Not on file    Forced sexual activity: Not on file  Other Topics Concern  . Not on file  Social History Narrative  . Not on file    History reviewed. No pertinent family history.  Current Facility-Administered Medications  Medication Dose Route Frequency Provider Last Rate Last Dose  . acetaminophen (TYLENOL) tablet 650 mg  650 mg Oral Q8H Alphonsa Overall, MD   650 mg at 02/07/18 0537  . HYDROcodone-acetaminophen (NORCO/VICODIN) 5-325 MG per tablet 1-2 tablet  1-2 tablet Oral Q4H PRN Meuth, Brooke A, PA-C   1 tablet at 02/07/18 0835  . ibuprofen (ADVIL,MOTRIN) tablet 400 mg  400 mg Oral Q4H PRN Meuth, Brooke A, PA-C      . methocarbamol (ROBAXIN) tablet 500 mg  500 mg Oral Q8H PRN Meuth, Brooke A, PA-C      . morphine 2 MG/ML injection 2 mg  2 mg Intravenous Q3H PRN Meuth, Brooke A, PA-C         Allergies  Allergen Reactions  . Latex Rash    Signed: Morton Peters, MD, FACS, MASCRS Gastrointestinal and Minimally Invasive Surgery    [address] [place], [place] [address] [phone] Main / Paging [phone] Fax   02/07/2018, 9:39 AM

## 2018-02-07 NOTE — Progress Notes (Signed)
Nurse reviewed discharge instructions with pt. Pt verbalized understanding of discharge instructions, follow up appointment and new medications.  All questions answered prior to discharge. °

## 2018-02-11 NOTE — Addendum Note (Signed)
Addendum  created 02/11/18 1227 by Lance Coon, CRNA   Charge Capture section accepted

## 2018-02-11 NOTE — Addendum Note (Signed)
Addendum  created 02/11/18 1149 by Lance Coon, CRNA   Charge Capture section accepted, Visit diagnoses modified

## 2018-03-20 DIAGNOSIS — F411 Generalized anxiety disorder: Secondary | ICD-10-CM | POA: Diagnosis not present

## 2018-03-20 DIAGNOSIS — F39 Unspecified mood [affective] disorder: Secondary | ICD-10-CM | POA: Diagnosis not present

## 2018-04-20 DIAGNOSIS — F900 Attention-deficit hyperactivity disorder, predominantly inattentive type: Secondary | ICD-10-CM | POA: Diagnosis not present

## 2018-05-12 DIAGNOSIS — S2232XA Fracture of one rib, left side, initial encounter for closed fracture: Secondary | ICD-10-CM | POA: Diagnosis not present

## 2018-10-08 DIAGNOSIS — F900 Attention-deficit hyperactivity disorder, predominantly inattentive type: Secondary | ICD-10-CM | POA: Diagnosis not present

## 2019-05-14 DIAGNOSIS — F900 Attention-deficit hyperactivity disorder, predominantly inattentive type: Secondary | ICD-10-CM | POA: Diagnosis not present

## 2019-11-29 DIAGNOSIS — F900 Attention-deficit hyperactivity disorder, predominantly inattentive type: Secondary | ICD-10-CM | POA: Diagnosis not present

## 2020-02-21 IMAGING — CT CT ABD-PELV W/ CM
2 of 4 series · 16 of 46 positions shown, 18 images · IV contrast (ISOVUE)
Comparison: None.

CLINICAL DATA: Right lower quadrant abdominal pain.

EXAM:
CT ABDOMEN AND PELVIS WITH CONTRAST
TECHNIQUE: Multidetector CT imaging of the abdomen and pelvis was performed
using the standard protocol following bolus administration of
intravenous contrast.
CONTRAST:  100mL R2IE4F-BWW IOPAMIDOL (R2IE4F-BWW) INJECTION 61%

[Series 2: axial st · axial · 0.87mm/px · z∈[-545,-145]mm · 13 of 90 slices shown, 15 images]
[im 5/90  soft-tissue]
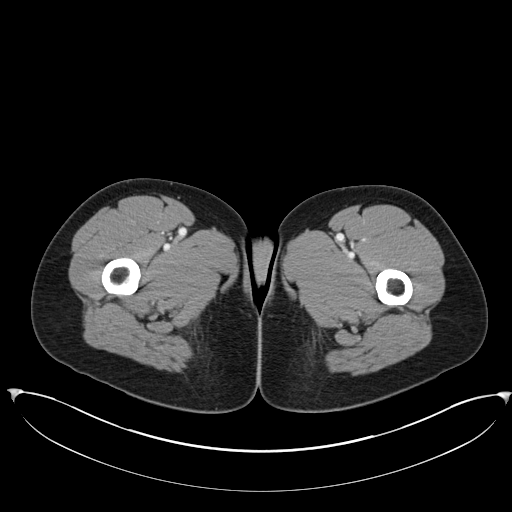
[im 5/90  bone]
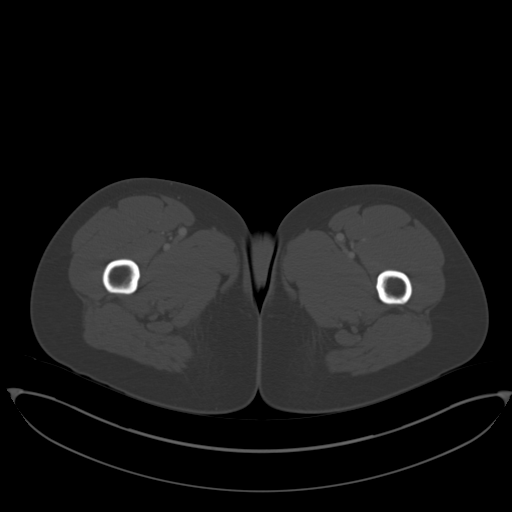
[im 15/90  soft-tissue]
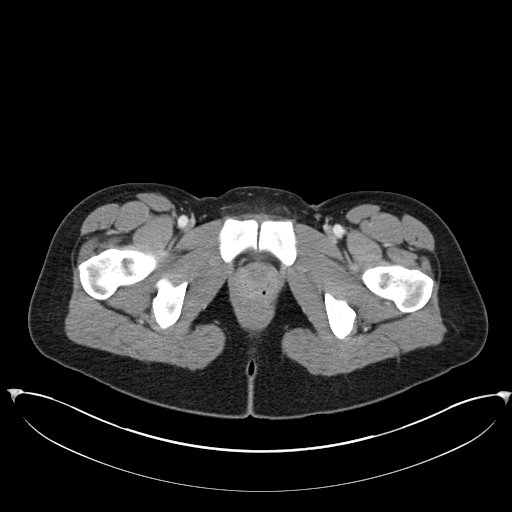
[im 19/90  soft-tissue]
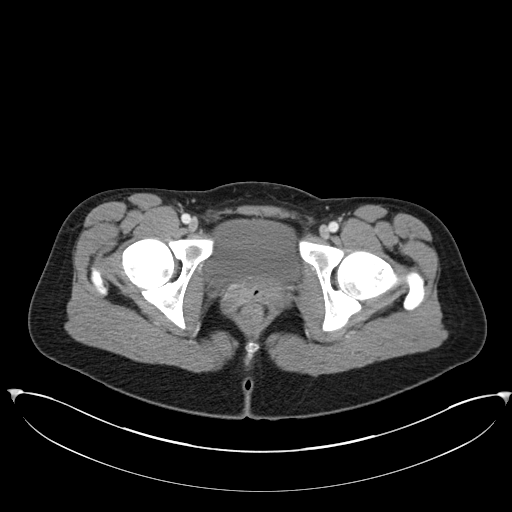
[im 24/90  soft-tissue]
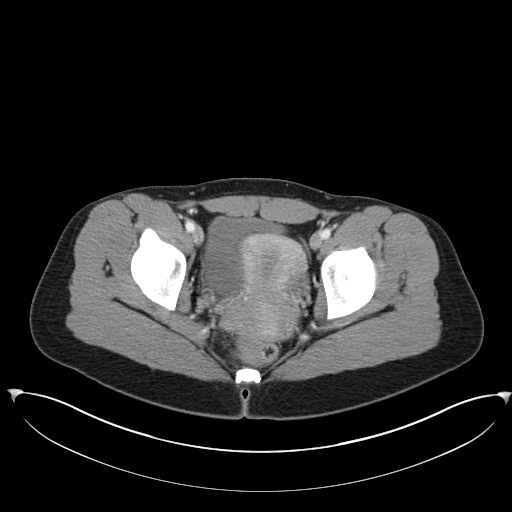
[im 33/90  soft-tissue]
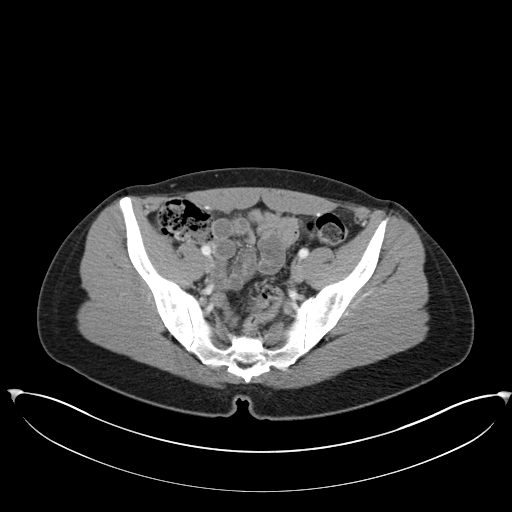
[im 38/90  soft-tissue]
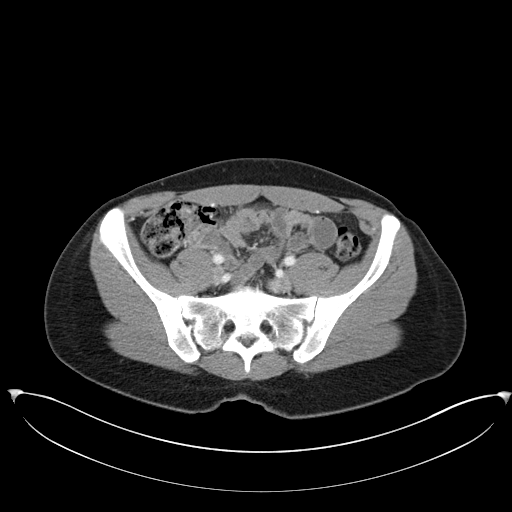
[im 47/90  soft-tissue]
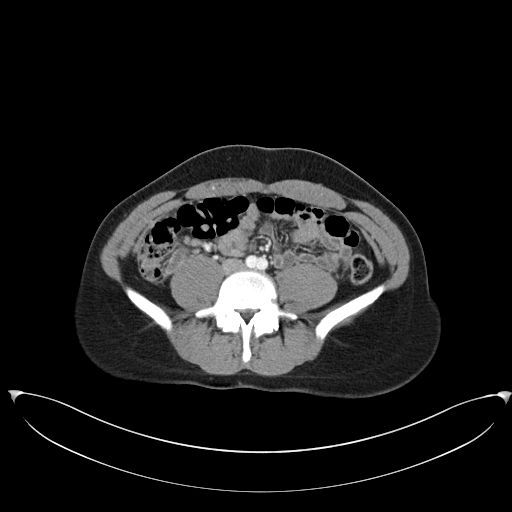
[im 52/90  soft-tissue]
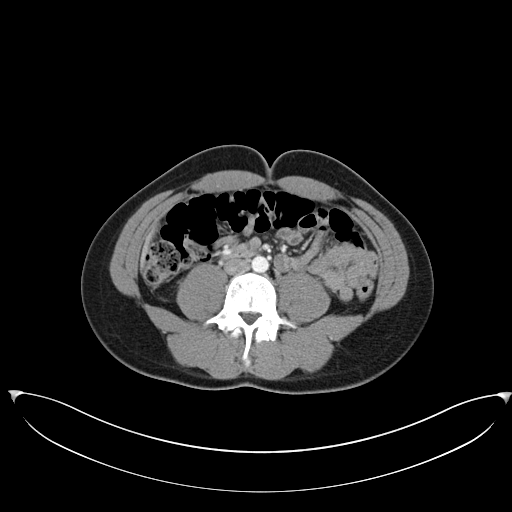
[im 57/90  soft-tissue]
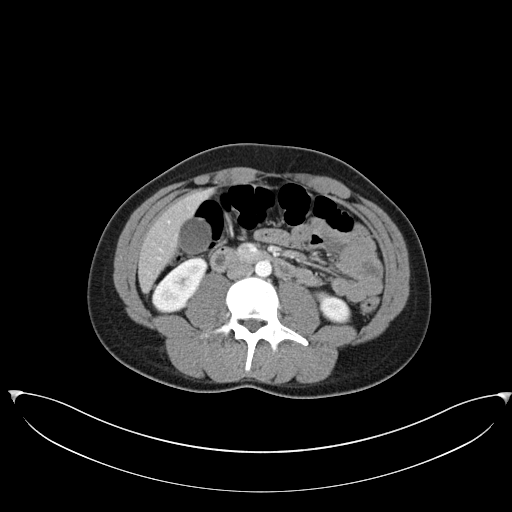
[im 57/90  bone]
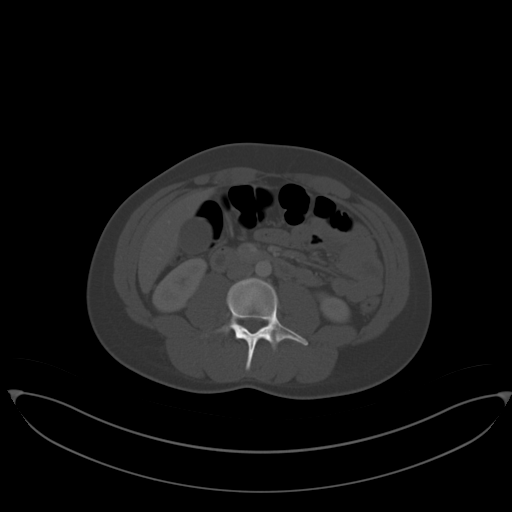
[im 66/90  soft-tissue]
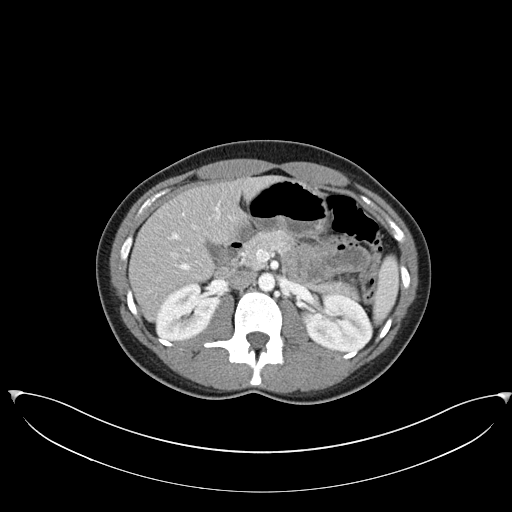
[im 71/90  soft-tissue]
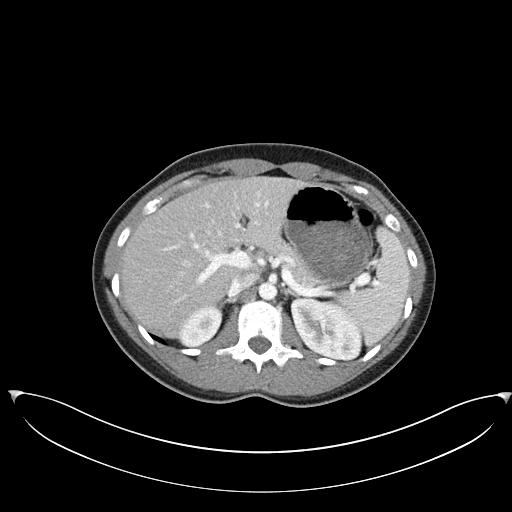
[im 75/90  soft-tissue]
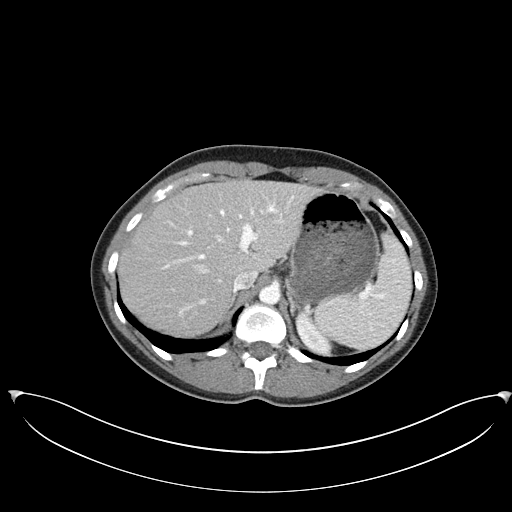
[im 85/90  soft-tissue]
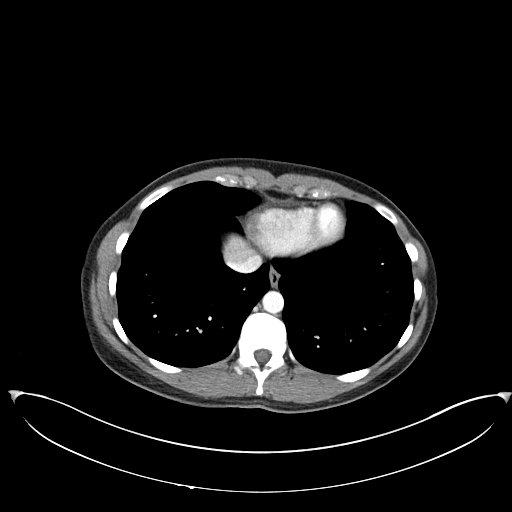

[Series 4: coronal st · coronal · 0.85mm/px · 3 of 99 slices shown]
[im 33/99  soft-tissue]
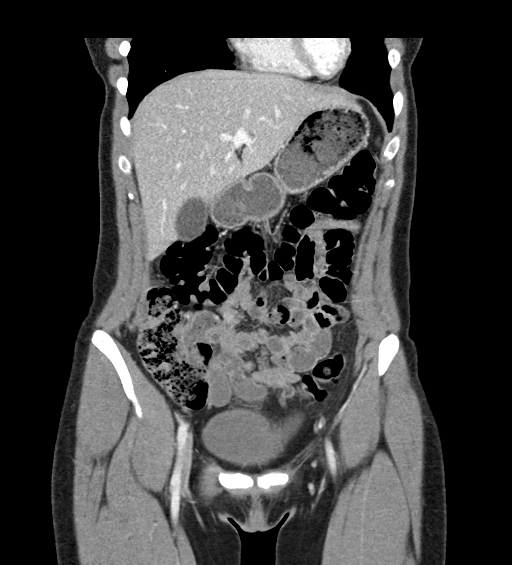
[im 44/99  soft-tissue]
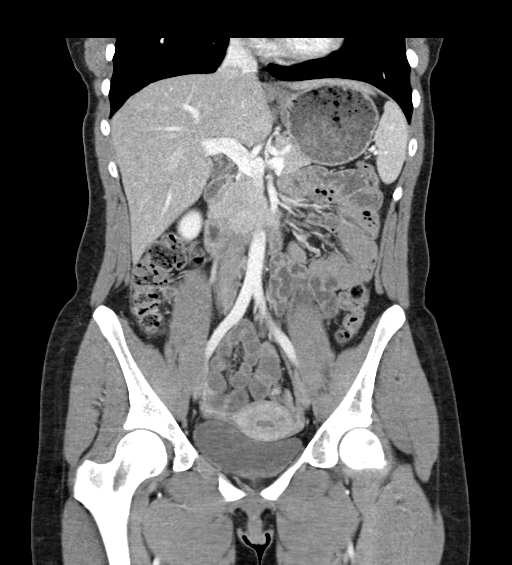
[im 55/99  soft-tissue]
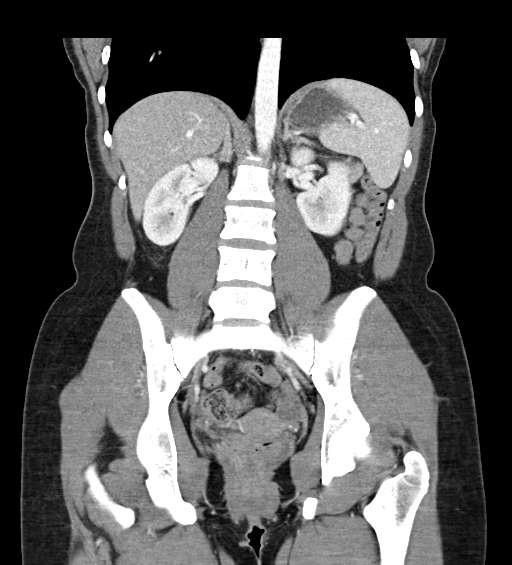

[16 of 46 positions shown; findings below may reference images not displayed]

FINDINGS: Lower chest: Lung bases are clear.

Hepatobiliary: No focal liver abnormality is seen. No gallstones,
gallbladder wall thickening, or biliary dilatation.

Pancreas: Unremarkable. No pancreatic ductal dilatation or
surrounding inflammatory changes.

Spleen: Normal in size without focal abnormality.

Adrenals/Urinary Tract: Adrenal glands are unremarkable. Kidneys are
normal, without renal calculi, focal lesion, or hydronephrosis.
Bladder is unremarkable.

Stomach/Bowel: Stomach, small bowel, and colon are not abnormally
distended. Dilated fluid-filled appendix with mucosal hyperemia and
mild periappendiceal stranding suggesting early acute appendicitis.
No abscess.

Appendix: Location: Retrocecal and anterior to the psoas muscle.

Diameter: 10 mm

Appendicolith: No

Mucosal hyper-enhancement: Yes

Extraluminal gas: No

Periappendiceal collection: No

Vascular/Lymphatic: No significant vascular findings are present. No
enlarged abdominal or pelvic lymph nodes.

Reproductive: Uterus and bilateral adnexa are unremarkable.

Other: No abdominal wall hernia or abnormality. No abdominopelvic
ascites.

Musculoskeletal: No acute or significant osseous findings.
IMPRESSION: Findings consistent with early acute appendicitis. No evidence of
appendiceal rupture or abscess.

## 2020-03-13 DIAGNOSIS — Z20822 Contact with and (suspected) exposure to covid-19: Secondary | ICD-10-CM | POA: Diagnosis not present

## 2020-03-13 DIAGNOSIS — R051 Acute cough: Secondary | ICD-10-CM | POA: Diagnosis not present

## 2020-05-08 DIAGNOSIS — L03316 Cellulitis of umbilicus: Secondary | ICD-10-CM | POA: Diagnosis not present

## 2023-08-28 DIAGNOSIS — N760 Acute vaginitis: Secondary | ICD-10-CM | POA: Diagnosis not present

## 2023-09-05 DIAGNOSIS — N898 Other specified noninflammatory disorders of vagina: Secondary | ICD-10-CM | POA: Diagnosis not present

## 2023-09-05 DIAGNOSIS — Z01419 Encounter for gynecological examination (general) (routine) without abnormal findings: Secondary | ICD-10-CM | POA: Diagnosis not present

## 2023-10-17 DIAGNOSIS — F419 Anxiety disorder, unspecified: Secondary | ICD-10-CM | POA: Diagnosis not present

## 2023-10-17 DIAGNOSIS — F900 Attention-deficit hyperactivity disorder, predominantly inattentive type: Secondary | ICD-10-CM | POA: Diagnosis not present

## 2023-10-23 DIAGNOSIS — F4323 Adjustment disorder with mixed anxiety and depressed mood: Secondary | ICD-10-CM | POA: Diagnosis not present

## 2023-10-31 DIAGNOSIS — F4323 Adjustment disorder with mixed anxiety and depressed mood: Secondary | ICD-10-CM | POA: Diagnosis not present

## 2023-11-24 DIAGNOSIS — F900 Attention-deficit hyperactivity disorder, predominantly inattentive type: Secondary | ICD-10-CM | POA: Diagnosis not present

## 2023-12-30 DIAGNOSIS — F411 Generalized anxiety disorder: Secondary | ICD-10-CM | POA: Diagnosis not present

## 2024-01-08 DIAGNOSIS — F411 Generalized anxiety disorder: Secondary | ICD-10-CM | POA: Diagnosis not present

## 2024-01-15 DIAGNOSIS — F411 Generalized anxiety disorder: Secondary | ICD-10-CM | POA: Diagnosis not present

## 2024-01-22 DIAGNOSIS — F411 Generalized anxiety disorder: Secondary | ICD-10-CM | POA: Diagnosis not present

## 2024-01-27 DIAGNOSIS — F411 Generalized anxiety disorder: Secondary | ICD-10-CM | POA: Diagnosis not present

## 2024-02-06 DIAGNOSIS — F411 Generalized anxiety disorder: Secondary | ICD-10-CM | POA: Diagnosis not present

## 2024-02-17 DIAGNOSIS — R5383 Other fatigue: Secondary | ICD-10-CM | POA: Diagnosis not present

## 2024-02-17 DIAGNOSIS — N912 Amenorrhea, unspecified: Secondary | ICD-10-CM | POA: Diagnosis not present

## 2024-02-17 DIAGNOSIS — Z113 Encounter for screening for infections with a predominantly sexual mode of transmission: Secondary | ICD-10-CM | POA: Diagnosis not present

## 2024-02-17 DIAGNOSIS — N898 Other specified noninflammatory disorders of vagina: Secondary | ICD-10-CM | POA: Diagnosis not present

## 2024-02-17 DIAGNOSIS — F411 Generalized anxiety disorder: Secondary | ICD-10-CM | POA: Diagnosis not present

## 2024-02-19 DIAGNOSIS — F900 Attention-deficit hyperactivity disorder, predominantly inattentive type: Secondary | ICD-10-CM | POA: Diagnosis not present

## 2024-03-16 DIAGNOSIS — F411 Generalized anxiety disorder: Secondary | ICD-10-CM | POA: Diagnosis not present

## 2024-03-19 DIAGNOSIS — F900 Attention-deficit hyperactivity disorder, predominantly inattentive type: Secondary | ICD-10-CM | POA: Diagnosis not present

## 2024-03-26 DIAGNOSIS — Z713 Dietary counseling and surveillance: Secondary | ICD-10-CM | POA: Diagnosis not present

## 2024-03-26 DIAGNOSIS — Z8241 Family history of sudden cardiac death: Secondary | ICD-10-CM | POA: Diagnosis not present

## 2024-03-26 DIAGNOSIS — Z6823 Body mass index (BMI) 23.0-23.9, adult: Secondary | ICD-10-CM | POA: Diagnosis not present

## 2024-03-31 DIAGNOSIS — F411 Generalized anxiety disorder: Secondary | ICD-10-CM | POA: Diagnosis not present

## 2024-05-05 DIAGNOSIS — B001 Herpesviral vesicular dermatitis: Secondary | ICD-10-CM | POA: Diagnosis not present
